# Patient Record
Sex: Female | Born: 1970 | Race: White | Hispanic: No | Marital: Married | Smoking: Never smoker
Health system: Southern US, Community
[De-identification: ages and names within clinical notes are randomized; demographics above are authoritative.]

## PROBLEM LIST (undated history)

## (undated) DIAGNOSIS — J302 Other seasonal allergic rhinitis: Secondary | ICD-10-CM

## (undated) HISTORY — PX: WISDOM TOOTH EXTRACTION: SHX21

## (undated) HISTORY — PX: DILATION AND CURETTAGE OF UTERUS: SHX78

## (undated) HISTORY — PX: OTHER SURGICAL HISTORY: SHX169

## (undated) HISTORY — PX: LASIK: SHX215

## (undated) HISTORY — DX: Other seasonal allergic rhinitis: J30.2

---

## 1998-08-23 ENCOUNTER — Other Ambulatory Visit: Admission: RE | Admit: 1998-08-23 | Discharge: 1998-08-23 | Payer: Self-pay | Admitting: Obstetrics and Gynecology

## 1999-09-05 ENCOUNTER — Other Ambulatory Visit: Admission: RE | Admit: 1999-09-05 | Discharge: 1999-09-05 | Payer: Self-pay | Admitting: Obstetrics and Gynecology

## 2000-09-10 ENCOUNTER — Other Ambulatory Visit: Admission: RE | Admit: 2000-09-10 | Discharge: 2000-09-10 | Payer: Self-pay | Admitting: Obstetrics and Gynecology

## 2001-09-10 ENCOUNTER — Other Ambulatory Visit: Admission: RE | Admit: 2001-09-10 | Discharge: 2001-09-10 | Payer: Self-pay | Admitting: Obstetrics and Gynecology

## 2002-09-23 ENCOUNTER — Other Ambulatory Visit: Admission: RE | Admit: 2002-09-23 | Discharge: 2002-09-23 | Payer: Self-pay | Admitting: Obstetrics and Gynecology

## 2003-09-29 ENCOUNTER — Other Ambulatory Visit: Admission: RE | Admit: 2003-09-29 | Discharge: 2003-09-29 | Payer: Self-pay | Admitting: Obstetrics and Gynecology

## 2004-10-03 ENCOUNTER — Other Ambulatory Visit: Admission: RE | Admit: 2004-10-03 | Discharge: 2004-10-03 | Payer: Self-pay | Admitting: Obstetrics and Gynecology

## 2005-10-19 ENCOUNTER — Other Ambulatory Visit: Admission: RE | Admit: 2005-10-19 | Discharge: 2005-10-19 | Payer: Self-pay | Admitting: Gynecology

## 2006-05-03 ENCOUNTER — Inpatient Hospital Stay (HOSPITAL_COMMUNITY): Admission: AD | Admit: 2006-05-03 | Discharge: 2006-05-07 | Payer: Self-pay | Admitting: Gynecology

## 2006-06-19 ENCOUNTER — Other Ambulatory Visit: Admission: RE | Admit: 2006-06-19 | Discharge: 2006-06-19 | Payer: Self-pay | Admitting: Gynecology

## 2007-06-25 ENCOUNTER — Other Ambulatory Visit: Admission: RE | Admit: 2007-06-25 | Discharge: 2007-06-25 | Payer: Self-pay | Admitting: Gynecology

## 2007-10-12 ENCOUNTER — Encounter: Payer: Self-pay | Admitting: Obstetrics and Gynecology

## 2007-10-12 ENCOUNTER — Ambulatory Visit (HOSPITAL_COMMUNITY): Admission: RE | Admit: 2007-10-12 | Discharge: 2007-10-12 | Payer: Self-pay | Admitting: Obstetrics and Gynecology

## 2008-02-17 ENCOUNTER — Ambulatory Visit (HOSPITAL_COMMUNITY): Admission: RE | Admit: 2008-02-17 | Discharge: 2008-02-17 | Payer: Self-pay | Admitting: Obstetrics and Gynecology

## 2008-02-17 ENCOUNTER — Encounter (INDEPENDENT_AMBULATORY_CARE_PROVIDER_SITE_OTHER): Payer: Self-pay | Admitting: Obstetrics and Gynecology

## 2009-03-29 ENCOUNTER — Other Ambulatory Visit: Payer: Self-pay | Admitting: Obstetrics and Gynecology

## 2009-03-29 ENCOUNTER — Inpatient Hospital Stay (HOSPITAL_COMMUNITY): Admission: RE | Admit: 2009-03-29 | Discharge: 2009-04-01 | Payer: Self-pay | Admitting: Obstetrics and Gynecology

## 2010-05-17 ENCOUNTER — Ambulatory Visit: Payer: Self-pay | Admitting: Obstetrics and Gynecology

## 2010-05-17 ENCOUNTER — Other Ambulatory Visit: Admission: RE | Admit: 2010-05-17 | Discharge: 2010-05-17 | Payer: Self-pay | Admitting: Gynecology

## 2010-11-16 ENCOUNTER — Encounter: Payer: Self-pay | Admitting: Obstetrics and Gynecology

## 2010-12-19 ENCOUNTER — Other Ambulatory Visit: Payer: Self-pay | Admitting: Obstetrics and Gynecology

## 2010-12-19 DIAGNOSIS — Z1231 Encounter for screening mammogram for malignant neoplasm of breast: Secondary | ICD-10-CM

## 2011-01-04 ENCOUNTER — Ambulatory Visit
Admission: RE | Admit: 2011-01-04 | Discharge: 2011-01-04 | Disposition: A | Discharge status: Home / Self Care | Payer: PRIVATE HEALTH INSURANCE | Source: Ambulatory Visit | Attending: Obstetrics and Gynecology | Admitting: Obstetrics and Gynecology

## 2011-01-04 DIAGNOSIS — Z1231 Encounter for screening mammogram for malignant neoplasm of breast: Secondary | ICD-10-CM

## 2011-01-23 LAB — CBC
HCT: 30.2 % — ABNORMAL LOW (ref 36.0–46.0)
Hemoglobin: 10.7 g/dL — ABNORMAL LOW (ref 12.0–15.0)
Hemoglobin: 13.3 g/dL (ref 12.0–15.0)
MCHC: 35.6 g/dL (ref 30.0–36.0)
MCHC: 35.2 g/dL (ref 30.0–36.0)
MCV: 96 fL (ref 78.0–100.0)
Platelets: 103 10*3/uL — ABNORMAL LOW (ref 150–400)
RDW: 12.9 % (ref 11.5–15.5)
RDW: 13.2 % (ref 11.5–15.5)
WBC: 10.7 10*3/uL — ABNORMAL HIGH (ref 4.0–10.5)

## 2011-01-23 LAB — RPR: RPR Ser Ql: NONREACTIVE

## 2011-02-28 NOTE — Op Note (Signed)
Jasmine [name], Jasmine [name]              ACCOUNT NO.:  [id number]   MEDICAL RECORD NO.:  [id number]          PATIENT TYPE:  INP   LOCATION:  9145                          [facility]ILITY:  [facility]   PHYSICIAN:  Lenoard Aden, M.D.DATE OF BIRTH:  03-03-1971   DATE OF PROCEDURE:  03/29/2009  DATE OF DISCHARGE:                               OPERATIVE REPORT   PREOPERATIVE DIAGNOSIS:  Previous C-section at 39-3/7 weeks' gestation.   POSTOPERATIVE DIAGNOSIS:  Previous C-section at 39-3/7 weeks' gestation.   PROCEDURE:  Repeat low segment transverse cesarean section.   SURGEON:  Lenoard Aden, MD   ASSISTANT:  None.   ANESTHESIA:  Spinal, Dorinda Hill T. Pamalee Leyden, MD   ESTIMATED BLOOD LOSS:  700 mL.   COMPLICATIONS:  None.   DRAINS:  Foley.   COUNTS:  Correct.   DISPOSITION:  The patient to recovery in good condition.   FINDINGS:  Full term living female 8 pounds 15 ounces, occiput anterior  position, Apgars 9 and 9.  Pediatricians attendance.  Posterior  placenta.  Normal uterus, normal tubes, normal ovaries.  Two-layer  uterine closure.   BRIEF OPERATIVE NOTE:  After being apprised of risks of anesthesia,  infection, bleeding, injury to bowel organs, need for repair delayed  versus immediate complications to include bowel and bladder injury.  The  patient was brought to the operating room where she was administered a  spinal anesthetic without complications, prepped and draped in usual  sterile fashion.  Foley catheter placed after achieving adequate  anesthesia.  Dilute Marcaine solution placed.  Pfannenstiel skin  incision made with scalpel and carried down to fascia which was nicked  in the midline and opened transversely using Mayo scissors.  Rectus  muscles were dissected sharply in the midline.  Peritoneum was entered  sharply.  Bladder blade placed.  Visceral peritoneum scored sharply off  the lower uterine segment.  Kerr hysterotomy incision made.  Atraumatic  delivery full term  living female as noted, handed to pediatricians  attendance.  Apgars 9 and 9.  Cord blood collected.  Placenta delivered  manually, intact 3-vessel cord.  Uterus was exteriorized, curetted using  a dry lap pack and closed in 2 running imbricating layers of 0 Monocryl  suture.  Adequate hemostasis in the midline with 2 interrupted sutures.  Good hemostasis noted.  Irrigation  accomplished.  Bladder flap intact.  Clear urine noted at this time.  The fascia was reapproximated using a 0 Monocryl in a continuous running  fashion.  Skin was reapproximated using staples.  The patient tolerated  the procedure well and was transferred to recovery in good condition.      Lenoard Aden, M.D.  Electronically Signed     RJT/MEDQ  D:  03/29/2009  T:  03/29/2009  Job:  [id number]

## 2011-02-28 NOTE — Op Note (Signed)
NAMELAYAAN, MOTT              ACCOUNT NO.:  [id number]   MEDICAL RECORD NO.:  [id number]          PATIENT TYPE:  AMB   LOCATION:  SDC                           [facility]   PHYSICIAN:  Daniel L. Gottsegen, M.D.DATE OF BIRTH:  1971-03-10   DATE OF PROCEDURE:  10/12/2007  DATE OF DISCHARGE:                               OPERATIVE REPORT   PREOPERATIVE DIAGNOSIS:  Missed abortion.   POSTOPERATIVE DIAGNOSIS:  Missed abortion.   OPERATIONS:  Suction curettage.   SURGEON:  Edyth Gunnels, M.D.   ANESTHESIA:  MAC plus paracervical block.   INDICATIONS:  The patient is a 40 year old gravida 2 para 1, AB zero  whose last menstrual period was 08/10/2007.  She was seen on 10/04/2007.  An ultrasound was done which appeared to be consistent with a missed AB  rather than a viable pregnancy.  It was elected to watch her for a week  and do a follow-up serial ultrasound.  This was done on 10/11/2007.  At  this point the intrauterine sac with echogenic yolk sac was seen but it  actually had been decreased in size since the first ultrasound.  There  was no fetal heart activity.  The patient was significantly behind in  dates and based on the above a diagnosis of a missed AB was made.  The  patient had deliberated whether to have a spontaneous AB or have a  suction curettage to end the pregnancy and at this point, she elected to  do the suction curettage.  The patient is A+.   FINDINGS:  External is normal.  BUS is normal.  Vaginal is normal.  Cervix is clean.  Uterus is just top normal size and shape.  Adnexa  failed to reveal masses.  At the time of suction curettage tissue was  consistent with a missed AB.   PROCEDURE:  After adequate MAC anesthetic a 20 mL 1% plain paracervical  block was done by Dr. Eda Paschal.  Anesthesia was excellent.  The cervix  could then be dilated to a #23 Pratt dilator and a suction curettage was  done with a #8 vacuum curette.  Tissue was consistent with  a missed AB.  At the termination of procedure there was no significant bleeding and  the uterus was well contracted.  The procedure was terminated.  The  patient tolerated the procedure well.  Blood loss was minimal.  She left  the operating room in satisfactory condition.      Daniel L. Eda Paschal, M.D.  Electronically Signed     DLG/MEDQ  D:  10/12/2007  T:  10/12/2007  Job:  [id number]

## 2011-03-03 NOTE — Op Note (Signed)
[name], [name]              ACCOUNT NO.:  [id number]   MEDICAL RECORD NO.:  [id number]          PATIENT TYPE:  AMB   LOCATION:  SDC                           [facility]   PHYSICIAN:  Lenoard Aden, M.D.DATE OF BIRTH:  05/28/1971   DATE OF PROCEDURE:  03/08/2008  DATE OF DISCHARGE:                               OPERATIVE REPORT   PREOPERATIVE DIAGNOSIS:  Missed abortion.   POSTOPERATIVE DIAGNOSIS:  Missed abortion.   PROCEDURE:  Suction D and E.   SURGEON:  Lenoard Aden, MD   ANESTHESIA:  MAC paracervical.   ESTIMATED BLOOD LOSS:  50 mL.   COMPLICATIONS:  None.   DRAINS:  None.   COUNTS:  Correct.   The patient to recovery in good condition.  Products of conception to  pathology and separately for chromosomal analysis for recurrent  pregnancy loss.   BRIEF OPERATIVE NOTE:  After being apprised of risks of anesthesia,  infection, bleeding, need for repair, delayed versus immediate  complications to include possible uterine perforation, need for repair,  the patient was brought to the operating room, where she was  administered a IV sedation without difficulty, prepped and draped in  usual sterile fashion, catheterized and the bladder was emptied  achieving adequate anesthesia.  Weighted speculum placed per vagina.  A  single-tooth tenaculum placed on the anterior lip of cervix.  Cervix is  sounded to uterus sounds at 12 cm.  Cervix was easily dilated with #25  Shawnie Pons dilator.  Curved suction curette 8 mm was placed.  Products of  conception aspirated and noted.  Repeat suction curettage in four-  quadrant method reveals cavity to be empty, tissue collected for  chromosomes.  Good hemostasis noted.  There was a bleeding from the left  anterior lip tenaculum site which was secured using a 2-0 chromic suture  in silver nitrate.  Good hemostasis noted.  All instruments removed.  The patient tolerated the procedure well and was transferred to recovery  in  good condition.      Lenoard Aden, M.D.  Electronically Signed    RJT/MEDQ  D:  02/17/2008  T:  02/17/2008  Job:  [id number]

## 2011-03-03 NOTE — Op Note (Signed)
NAMEJENNALEE, Jasmine [name]              ACCOUNT NO.:  [id number]   MEDICAL RECORD NO.:  [id number]          PATIENT TYPE:  INP   LOCATION:  9163                          [facility]   PHYSICIAN:  Timothy P. Fontaine, M.D.DATE OF BIRTH:  February 02, 1971   DATE OF PROCEDURE:  05/04/2006  DATE OF DISCHARGE:                                 OPERATIVE REPORT   PREOPERATIVE DIAGNOSES:  1.  Intrauterine pregnancy at term.  2.  Cephalopelvic disproportion.  3.  Arrest of dilatation.   PROCEDURE:  Primary low transverse cervical cesarean section.   SURGEON:  Timothy P. Fontaine, M.D.   ASSISTANT:  Scrub technician.   ANESTHETIC:  Epidural.   COMPLICATIONS:  None.   SPECIMEN:  Samples of cord blood.   ESTIMATED BLOOD LOSS:  Less than 500 mL.   FINDINGS:  At 38, normal female, Apgar scores 9/9, weight 8 pounds 10  ounces.  Pelvic anatomy noted to be normal.   PROCEDURE:  The patient was taken to the operating room, underwent dosing of  her epidural catheter, was placed in the left tilt supine position, received  an abdominal preparation with Betadine solution and was draped in the usual  fashion.  The patient had a Foley catheter previously placed on Labor and  Delivery and it is noted that the patient did have hematuria prior to  initiation of the procedure.  After assuring adequate anesthesia, the  abdomen was sharply entered through a Pfannenstiel incision, achieving  adequate hemostasis at all levels.  The bladder flap was sharply and bluntly  developed without difficulty, uterus sharply entered in the lower uterine  segment and bluntly extended laterally.  The fluid was noted to be clear,  the infant's head delivered through the incision, nares and mouth suctioned,  a nuchal cord x1 was reduced, the rest of the infant delivered, the cord  doubly clamped and cut and the infant handed to Pediatrics in attendance.  Samples of cord blood were obtained.  Placenta was then spontaneously  extruded and noted to be intact.  The public cord blood banking was not  obtained due to the hour and it was not available at this time.  The uterus  was then exteriorized, the endometrial cavity explored with the sponge to  remove all placental and membrane fragments.  The patient received 1-g Ancef  antibiotic prophylaxis at this time.  The uterine incision was then closed  in 2 layers using 0 Vicryl suture, first with a running interlocking stitch  followed by an imbricating stitch.  The uterus was returned to the abdomen,  which was copiously irrigated, adequate hemostasis visualized, the anterior  fascia reapproximated using 0 Vicryl suture in a running stitch.  Subcutaneous tissues were  irrigated, adequate hemostasis achieved with electrocautery and the skin was  reapproximated using 4-0 Vicryl in a running subcuticular stitch, Steri-  Strips and Benzoin applied, pressure dressing applied.  The patient was  taken to the recovery room in good condition, having tolerated the procedure  well      Timothy P. Fontaine, M.D.  Electronically Signed     TPF/MEDQ  D:  05/04/2006  T:  05/05/2006  Job:  [id number]

## 2011-03-03 NOTE — Consult Note (Signed)
Jasmine [name], Jasmine [name]              ACCOUNT NO.:  [id number]   MEDICAL RECORD NO.:  [id number]          PATIENT TYPE:  INP   LOCATION:  9163                          [facility]   PHYSICIAN:  Timothy P. Fontaine, M.D.DATE OF BIRTH:  [birth year]   DATE OF CONSULTATION:  05/04/2006  DATE OF DISCHARGE:                                   CONSULTATION   Patient evaluated on labor and delivery.  External tracing shows  contractions with an adequate contraction pattern, approximately 200 MVU's  units and a reactive, reassuring tracing with early type mild decelerations.  There is good beat-to-beat variability with accelerations noted.   PHYSICAL EXAMINATION:  PELVIC:  Vertex at approximately 0 to -1 station with  increasing caput noted.  Cervix 6 cm dilated, completely effaced.  Exam  reported per nursing at 12:30 was 6 cm.  Repeat at approximately 1600 at 6  cm.   I reviewed the situation with the patient and her husband.  Does not appear  to be any progress since her 12:30 exam.  These were two serial nursing with  my initial exam now.  I feel, given the total picture, that most likely we  have cephalopelvic disproportion.  My recommendation was to do a short  interval exam at approximately a half hour, again, to confirm no change in  the cervix, recognizing it as a short interval but total length was 4-5  hours.  If the exam remains unchanged, then would proceed with a primary  cesarean section.  The procedure was reviewed, which involved the expected  intraoperative postoperative course and the risks of bleeding, transfusion.  The risks of transfusion, with transfusion reaction, hepatitis, HIV, mad cow  disease and other unknown entities.  The risks of infection requiring  prolonged antibiotics as well as the risk of bleeding complications,  including opening and draining of incisions, closure by secondary intention  was all discussed, withstood, and accepted.  The risk of inadvertant  injury  to internal organs, including bowel, bladder, ureters, vessels, and nerves,  necessitating major exploratory reparative surgeries, and future reparative  surgeries, including ostomy formation, was all discussed, understood, and  accepted.  The risk of fetal injury during the birthing process, including  musculoskeletal, neural, scalpel injuries was all reviewed and accepted.  The patient and her husband's questions were answered, and they are  comfortable with the plan.      Timothy P. Fontaine, M.D.  Electronically Signed     TPF/MEDQ  D:  05/04/2006  T:  05/04/2006  Job:  [id number]

## 2011-03-03 NOTE — Discharge Summary (Signed)
NAMEPARTHENA, FERGESON              ACCOUNT NO.:  [id number]   MEDICAL RECORD NO.:  [id number]          PATIENT TYPE:  INP   LOCATION:  9145                          [facility]   PHYSICIAN:  Lenoard Aden, M.D.DATE OF BIRTH:  1971-08-29   DATE OF ADMISSION:  03/29/2009  DATE OF DISCHARGE:  04/01/2009                               DISCHARGE SUMMARY   ADMITTING DIAGNOSIS:  72 weeks' gestation, previous cesarean section,  desires repeat cesarean section.   DISCHARGE DIAGNOSES:  Postoperative day #3, repeat cesarean section,  stable status, mild gestational thrombocytopenia, stable.   The patient is a 40 year old gravida 4, para 1-0-2-1 at 2 weeks and 5  days' gestation, presents for repeat scheduled cesarean section.   HOSPITAL PRESENTATION:  The patient is here for scheduled repeat  cesarean section.   DICTATION ENDED AT THIS POINT.      Marlinda Mike, C.N.M.      Lenoard Aden, M.D.  Electronically Signed    TB/MEDQ  D:  05/01/2009  T:  05/01/2009  Job:  [id number]

## 2011-03-03 NOTE — Discharge Summary (Signed)
NAMEXAVIERA, [name]              ACCOUNT NO.:  [id number]   MEDICAL RECORD NO.:  [id number]          PATIENT TYPE:  INP   LOCATION:  9145                          [facility]   PHYSICIAN:  Lenoard Aden, M.D.DATE OF BIRTH:  1970/12/01   DATE OF ADMISSION:  03/29/2009  DATE OF DISCHARGE:  04/01/2009                               DISCHARGE SUMMARY   ADMITTING DIAGNOSIS:  39 weeks' gestation and 3 days, previous cesarean  section, desires repeat.   DISCHARGE DIAGNOSES:  Postoperative day #3, repeat cesarean section,  stable status, mild gestational thrombocytopenia.   PRENATAL COURSE:  The patient is a gravida 4, para 1-0-2-1 at 63 weeks'  gestation.  Prenatal care has been at [facility] since 8 weeks'  gestation with Dr. Billy Coast as primary.   PRENATAL LABS:  The patient is A positive, rubella positive, GBS  positive, RPR negative, syphilis screen negative, HIV negative, and EDC  of April 02, 2009.  Prenatal course has been uneventful with noted mild  gestational thrombocytopenia with a nadir platelets of 122.   HOSPITAL PRESENTATION:  The patient presents for repeat scheduled  cesarean section.  Vital signs are within normal limits.  The patient is  afebrile.  Preop CBC, white blood cell count 10.7, hemoglobin 10.9,  hematocrit 31.2, and platelet count of 183.  Delivery, the patient was  delivered by Dr. Billy Coast, repeat cesarean section on March 29, 2009, of a  female newborn at 8 pounds 15 ounces, see operative note.   POSTOPERATIVE COURSE:  Postoperative CBC, white blood cell count 8.9,  hemoglobin 10.7, hematocrit 30.2, and platelet count of 115.  Vital  signs remained normal.  The patient was afebrile during first operative  course and vital signs on day of discharge were 98.2, 66, 18, and  104/62.  Activity is ad lib.  The patient is up ambulating, tolerating  diet.  There is a mild adhesive tape reaction with mild erythema.  No  excoriations.  On day of discharge,  incision is intact with staples.  No  drainage.  No erythema.  Mild amount of ecchymosis noted below the  incision.  Decision was made for staple removal on day 7 versus on day  of discharge due to a small amount of bruising and repeat cesarean  section.   DISCHARGE INSTRUCTIONS:  Per [facility] booklet for postoperative  care.  The patient is on a regular diet.  Activity is ad lib as  tolerated per postoperative instructions.   MEDICATIONS AT THE TIME OF DISCHARGE:  1. Prenatal vitamin one p.o. daily.  2. Motrin over-the-counter 2-4 tablets every 8 hours as needed for      discomfort.  3. Darvocet-N 100 one p.o. q.4-6 h. as needed for pain.   The patient IS to follow up at [facility] at 6 weeks' postpartum  visit.      Marlinda Mike, C.N.M.      Lenoard Aden, M.D.  Electronically Signed    TB/MEDQ  D:  05/01/2009  T:  05/01/2009  Job:  [id number]

## 2011-03-03 NOTE — H&P (Signed)
Jasmine [name], Jasmine [name]              ACCOUNT NO.:  [id number]   MEDICAL RECORD NO.:  [id number]          PATIENT TYPE:  MAT   LOCATION:  MATC                          [facility]   PHYSICIAN:  Juan H. Lily Peer, M.D.DATE OF BIRTH:  May 24, 1971   DATE OF ADMISSION:  05/03/2006  DATE OF DISCHARGE:                                HISTORY & PHYSICAL   CHIEF COMPLAINT:  Spontaneous rupture of membranes.   HISTORY:  The patient is a 40 year old, gravida 1, para 0 with an estimated  date of confinement May 14, 2006, currently 38-1/2 weeks estimated  gestational age.  Presented to [facility] complaining of ruptured  membranes since 2230 hours on May 03, 2006.  On arrival, clear amniotic  fluid was present.  Nitrazine and ferning positive.  She is found to be  contracting every three to five minutes apart with a reassuring fetal heart  rate tracing.  Pelvic exam:  Her cervix was finger tip and 50% effaced,  vertex presentation by Thayer Ohm maneuver.  The patient's prenatal course  significant to the fact that due to advanced maternal age she was offered  genetic amniocentesis and declined.  She had first trimester screen which  was normal and had normal cystic fibrosis screen.  Did not have  alphafetaprotein testing.  The remainder of her prenatal course was  essentially unremarkable.  She denies any allergies.   REVIEW OF SYSTEMS:  See [employer] form.   PHYSICAL EXAMINATION:  VITAL SIGNS:  Her temperature was 98.6, pulse 87,  respiration 20, blood pressure 139/78.  HEENT:  Unremarkable.  NECK:  Supple.  Trachea midline.  No carotid bruits.  No thyromegaly.  LUNGS:  Clear to auscultation without any rhonchi or wheezes.  HEART:  Regular rate and rhythm.  No murmurs or gallops.  BREASTS:  Not done.  ABDOMEN:  Gravid uterus.  Vertex presentation by [facility] maneuver.  PELVIC:  Gross rupture of membranes, clear.  Cervix fingertip, 60-70%  effaced, vertex presentation, ballottable.  EXTREMITIES:  DTR 1+.  Negative clonus.  Trace edema.   PRENATAL LABS:  A positive blood, negative antibody screen.  VDRL was  nonreactive.  Rubella was negative.  Hepatitis B surface antigen and HIV  were negative.  The patient declined maternal serum alphafetaprotein.  Normal cystic fibrosis screen and normal first trimester screen.  Diabetes  screen was normal and GBS culture was reported to be negative.   ASSESSMENT:  A 40 year old, gravida 1, para 0, 38-1/2 weeks estimated  gestational age with gross rupture of membranes at 2230 hours, clear and in  labor.  Cervix fingertip, 60-70% effaced, vertex presentation by Thayer Ohm  maneuver. Group B strep culture negative.   PLAN:  Admit to labor and delivery.  Initiate Pitocin augmentation in the  event of protracted labor.  Anticipate vaginal delivery.      Juan H. Lily Peer, M.D.  Electronically Signed     JHF/MEDQ  D:  05/04/2006  T:  05/04/2006  Job:  [id number]

## 2011-03-03 NOTE — Discharge Summary (Signed)
Jasmine [name], Jasmine [name]              ACCOUNT NO.:  [id number]   MEDICAL RECORD NO.:  [id number]          PATIENT TYPE:  INP   LOCATION:  9128                          [facility]   PHYSICIAN:  Timothy P. Fontaine, M.D.DATE OF BIRTH:  07-May-1971   DATE OF ADMISSION:  05/03/2006  DATE OF DISCHARGE:  05/07/2006                                 DISCHARGE SUMMARY   DISCHARGE DIAGNOSES:  1.  Pregnancy at term.  2.  Cephalopelvic disproportion.   PROCEDURE:  Primary low transverse cervical cesarean section May 04, 2006,  Dr. Colin Broach.   HOSPITAL COURSE:  A 40 year old G1 P0 female at term gestation, who enters  with rupture of membranes and spontaneous contractions.  The patient was  initially fingertip dilated, ultimately was begun on Pitocin augmentation,  and despite adequate labor pattern had arrest of dilatation at 3-4 cm.  The  patient was counseled and ultimately underwent a primary low transverse  cervical cesarean section producing a normal female infant, Apgars  9 and 9,  weight 8 pounds 10 ounces, at 1811, May 04, 2006.  The patient's  postoperative course was uncomplicated.  She was discharged on postoperative  day #3 ambulating well, tolerating a regular diet with a postoperative  hemoglobin of 10.8.  The patient's blood type A+.  She is rubella negative  and is to receive the rubella vaccine before discharge.  The patient  received precautions, instructions and follow-up, will be seen in the office  in 6 weeks, and received a prescription for Tylox #25 one to two p.o. q.4-  6h. p.r.n. pain.      Timothy P. Fontaine, M.D.  Electronically Signed     TPF/MEDQ  D:  05/07/2006  T:  05/07/2006  Job:  [id number]

## 2011-05-22 ENCOUNTER — Encounter: Payer: Self-pay | Admitting: *Deleted

## 2011-05-25 ENCOUNTER — Encounter: Payer: Self-pay | Admitting: Gynecology

## 2011-05-25 ENCOUNTER — Ambulatory Visit (INDEPENDENT_AMBULATORY_CARE_PROVIDER_SITE_OTHER): Payer: 59 | Admitting: Gynecology

## 2011-05-25 ENCOUNTER — Other Ambulatory Visit (HOSPITAL_COMMUNITY)
Admission: RE | Admit: 2011-05-25 | Discharge: 2011-05-25 | Disposition: A | Discharge status: Home / Self Care | Payer: 59 | Source: Ambulatory Visit | Attending: Gynecology | Admitting: Gynecology

## 2011-05-25 VITALS — BP 112/70 | Ht 63.5 in | Wt 135.0 lb

## 2011-05-25 DIAGNOSIS — Z01419 Encounter for gynecological examination (general) (routine) without abnormal findings: Secondary | ICD-10-CM

## 2011-05-25 DIAGNOSIS — Z131 Encounter for screening for diabetes mellitus: Secondary | ICD-10-CM

## 2011-05-25 DIAGNOSIS — R5383 Other fatigue: Secondary | ICD-10-CM

## 2011-05-25 DIAGNOSIS — Z13220 Encounter for screening for lipoid disorders: Secondary | ICD-10-CM

## 2011-05-25 NOTE — Progress Notes (Signed)
Jasmine [name] 04-07-1971 [id number]        40 y.o.  for annual exam.  Past medical history,surgical history, allergies, family history and social history were all reviewed and documented in the EPIC chart. ROS:  Was performed and pertinent positives and negatives are included in the history.  Exam: chaperone present Filed Vitals:   05/25/11 0957  BP: 112/70   General appearance  Normal Skin grossly normal Head/Neck normal with no cervical or supraclavicular adenopathy thyroid normal Lungs  clear Cardiac RR, without RMG Abdominal  soft, nontender, without masses, organomegaly or hernia Breasts  examined lying and sitting without masses, retractions, discharge or axillary adenopathy. Pelvic  Ext/BUS/vagina  normal   Cervix  normal  Pap done  Uterus  anteverted, normal size, shape and contour, midline and mobile nontender   Adnexa  Without masses or tenderness    Anus and perineum  normal   Rectovaginal  normal sphincter tone without palpated masses or tenderness.    Assessment/Plan:  40 y.o. female for annual exam.   Doing well.   Using condoms for contraception. We discussed contraceptive options to include oral contraceptives, IUD, injectables and implants, sterilization.  Patient's comfortable with condoms she understands the failure risk. Availability of Plan B was discussed with her.   Self breast exams on a month basis discussed encouraged. She had her mammogram in March and will continue with annual mammography.   She does note some fatigue which I think is situational with children and work. She has no other symptoms such as weight gain weight loss hair or skin changes. Will check baseline CBC and TSH along with routine bloods of glucose lipid profile urinalysis. Assuming normal then she'll see me in a year sooner as needed.    Dara Lords MD, 10:33 AM 05/25/2011

## 2011-05-28 MED ORDER — FLUCONAZOLE 150 MG PO TABS: 150.0000 mg | ORAL_TABLET | Freq: Once | ORAL | Status: AC

## 2011-05-28 NOTE — Progress Notes (Signed)
Addended by: Dara Lords on: 05/28/2011 01:38 PM   Modules accepted: Orders

## 2011-06-12 ENCOUNTER — Telehealth: Payer: Self-pay | Admitting: *Deleted

## 2011-06-12 NOTE — Telephone Encounter (Signed)
Pt called wanting recent lab results, results given to patient.

## 2011-09-06 ENCOUNTER — Other Ambulatory Visit: Payer: Self-pay

## 2011-09-06 MED ORDER — FLUCONAZOLE 150 MG PO TABS: 150.0000 mg | ORAL_TABLET | Freq: Once | ORAL | Status: AC

## 2011-09-06 NOTE — Telephone Encounter (Signed)
PT. NOTIFIED RX SENT IN BY DR. TF.

## 2011-09-06 NOTE — Telephone Encounter (Signed)
Diflucan 150mg x 1

## 2011-09-06 NOTE — Telephone Encounter (Signed)
PT. C-O SEVERE VAGINAL ITCHING AND CAN NOT COME IN FOR AN O.V. REQUESTING DIFLUCAN BE CALLED IN. SHE SAW YOU ON 05-25-11 FOR HER AEX.

## 2012-01-25 ENCOUNTER — Other Ambulatory Visit: Payer: Self-pay | Admitting: Obstetrics and Gynecology

## 2012-01-25 DIAGNOSIS — Z1231 Encounter for screening mammogram for malignant neoplasm of breast: Secondary | ICD-10-CM

## 2012-02-07 ENCOUNTER — Ambulatory Visit
Admission: RE | Admit: 2012-02-07 | Discharge: 2012-02-07 | Disposition: A | Discharge status: Home / Self Care | Payer: Managed Care, Other (non HMO) | Source: Ambulatory Visit | Attending: Obstetrics and Gynecology | Admitting: Obstetrics and Gynecology

## 2012-02-07 DIAGNOSIS — Z1231 Encounter for screening mammogram for malignant neoplasm of breast: Secondary | ICD-10-CM

## 2012-06-27 ENCOUNTER — Encounter: Payer: Self-pay | Admitting: Gynecology

## 2012-07-12 ENCOUNTER — Encounter: Payer: Self-pay | Admitting: Gynecology

## 2012-07-12 ENCOUNTER — Ambulatory Visit (INDEPENDENT_AMBULATORY_CARE_PROVIDER_SITE_OTHER): Payer: Managed Care, Other (non HMO) | Admitting: Gynecology

## 2012-07-12 VITALS — BP 112/74 | Ht 64.5 in | Wt 133.0 lb

## 2012-07-12 DIAGNOSIS — N926 Irregular menstruation, unspecified: Secondary | ICD-10-CM

## 2012-07-12 DIAGNOSIS — Z131 Encounter for screening for diabetes mellitus: Secondary | ICD-10-CM

## 2012-07-12 DIAGNOSIS — Z13220 Encounter for screening for lipoid disorders: Secondary | ICD-10-CM

## 2012-07-12 DIAGNOSIS — Z01419 Encounter for gynecological examination (general) (routine) without abnormal findings: Secondary | ICD-10-CM

## 2012-07-12 LAB — CBC WITH DIFFERENTIAL/PLATELET
Hemoglobin: 13.4 g/dL (ref 12.0–15.0)
Lymphocytes Relative: 24 % (ref 12–46)
Lymphs Abs: 1.3 10*3/uL (ref 0.7–4.0)
Monocytes Relative: 8 % (ref 3–12)
Neutrophils Relative %: 66 % (ref 43–77)
Platelets: 160 10*3/uL (ref 150–400)
RBC: 4.53 MIL/uL (ref 3.87–5.11)
WBC: 5.5 10*3/uL (ref 4.0–10.5)

## 2012-07-12 LAB — LIPID PANEL
Cholesterol: 174 mg/dL (ref 0–200)
VLDL: 22 mg/dL (ref 0–40)

## 2012-07-12 LAB — TSH: TSH: 1.163 u[IU]/mL (ref 0.350–4.500)

## 2012-07-12 LAB — PROLACTIN: Prolactin: 4.9 ng/mL

## 2012-07-12 LAB — GLUCOSE, RANDOM: Glucose, Bld: 82 mg/dL (ref 70–99)

## 2012-07-12 NOTE — Progress Notes (Signed)
Jasmine [name] 1970-12-05 [id number]        41 y.o.  [id number] for annual exam.  Several issues noted below.  Past medical history,surgical history, medications, allergies, family history and social history were all reviewed and documented in the EPIC chart. ROS:  Was performed and pertinent positives and negatives are included in the history.  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 Pelvic  Ext/BUS/vagina  normal   Cervix  normal   Uterus  axial, normal size, shape and contour, midline and mobile nontender   Adnexa  Without masses or tenderness    Anus and perineum  normal   Rectovaginal  normal sphincter tone without palpated masses or tenderness.    Assessment/Plan:  41 y.o. [id number] female for annual exam.   1. Prolonged menses. Patient notes having monthly menses but they're lasting up to 7 days with lingering spotting. Been going on for 4-5 months. No significant dysmenorrhea, intermenstrual bleeding. Having some hot flashes perimenstually. We'll check baseline labs of prolactin TSH FSH and sonohysterogram to rule out structural abnormalities such as polyps, submucous myomas. Various scenarios were reviewed and we'll further discuss after laboratory/ultrasound results. 2. Contraception. Patient continues to use condoms. We've discussed the failure risks with this on multiple occasions and alternatives. Patient declined and desires to continue using condoms. Plan B back up reviewed. 3. Mammography. Patient had mammogram in April continue with annual mammography. SBE monthly  reviewed. 4. Pap smear. No Pap smear done today. Last Pap smear 2012. Patient has numerous normal reports with no history of abnormal Pap smears. We'll plan less frequent screening every 3-5 years per current screening guidelines. 5. Health maintenance.  Baseline CBC lipid profile glucose urinalysis ordered along with the other lab work. Follow up for ultrasound and lab results. 6.     Dara Lords MD, 11:14 AM 07/12/2012

## 2012-07-12 NOTE — Patient Instructions (Signed)
Follow up for ultrasound. 

## 2012-07-13 LAB — URINALYSIS W MICROSCOPIC + REFLEX CULTURE
Bilirubin Urine: NEGATIVE
Casts: NONE SEEN
Crystals: NONE SEEN
Glucose, UA: NEGATIVE mg/dL
Specific Gravity, Urine: 1.01 (ref 1.005–1.030)
Squamous Epithelial / LPF: NONE SEEN
Urobilinogen, UA: 0.2 mg/dL (ref 0.0–1.0)
pH: 8 (ref 5.0–8.0)

## 2012-07-19 ENCOUNTER — Telehealth: Payer: Self-pay | Admitting: Women's Health

## 2012-07-19 NOTE — Telephone Encounter (Signed)
Telephone call from patient states cycle started today, sonohysterogram with Dr. Audie Box scheduled for 10/16 which will be day 13. Will abstain until after her sonohysterogram.

## 2012-07-24 ENCOUNTER — Encounter: Payer: Self-pay | Admitting: Gynecology

## 2012-07-30 ENCOUNTER — Other Ambulatory Visit: Payer: Self-pay | Admitting: Gynecology

## 2012-07-30 DIAGNOSIS — N926 Irregular menstruation, unspecified: Secondary | ICD-10-CM

## 2012-07-31 ENCOUNTER — Ambulatory Visit (INDEPENDENT_AMBULATORY_CARE_PROVIDER_SITE_OTHER): Payer: Managed Care, Other (non HMO)

## 2012-07-31 ENCOUNTER — Ambulatory Visit (INDEPENDENT_AMBULATORY_CARE_PROVIDER_SITE_OTHER): Payer: Managed Care, Other (non HMO) | Admitting: Gynecology

## 2012-07-31 ENCOUNTER — Encounter: Payer: Self-pay | Admitting: Gynecology

## 2012-07-31 DIAGNOSIS — N926 Irregular menstruation, unspecified: Secondary | ICD-10-CM

## 2012-07-31 DIAGNOSIS — N888 Other specified noninflammatory disorders of cervix uteri: Secondary | ICD-10-CM

## 2012-07-31 DIAGNOSIS — N72 Inflammatory disease of cervix uteri: Secondary | ICD-10-CM

## 2012-07-31 DIAGNOSIS — N940 Mittelschmerz: Secondary | ICD-10-CM

## 2012-07-31 DIAGNOSIS — N831 Corpus luteum cyst of ovary, unspecified side: Secondary | ICD-10-CM

## 2012-07-31 DIAGNOSIS — N921 Excessive and frequent menstruation with irregular cycle: Secondary | ICD-10-CM

## 2012-07-31 NOTE — Progress Notes (Signed)
Patient presents for sonohysterogram due to metromenorrhagia with prolonged menses. Her blood work to include FSH TSH prolactin were normal.  Ultrasound shows uterus to be normal in size and echotexture. Endometrial echo 11.5 mm. Right and left ovaries normal with physiologic changes. Cul-de-sac negative. She did have a fair dilatation of the endocervical canal with echogenic debris. Sonohysterogram performed, tight cervix noted, sterile technique, easy catheter introduction, good distention with no abnormalities. Endometrial sample taken. Patient tolerated well.  Assessment and plan: Metrorrhagia. Exam/ultrasound suggests some mild cervical stenosis with accumulation of fluid/debris behind the external os. I dilated the cervix with a Silastic graduated dilator. Patient will follow up for biopsy results. Will keep menstrual calendar over the next several months and we will see how she does. I question whether mild cervical stenosis is causing her menses to be prolonged. She's not having significant dysmenorrhea or other evidence of accumulation. If continues possible dilatation under paracervical block more aggressively. Possible Mirena IUD or other hormonal manipulation to decrease menstrual production.

## 2012-07-31 NOTE — Patient Instructions (Signed)
Office will call you with the biopsy results. Monitor menses over the next 3-6 months. Call if prolonged/heavy bleeding continues.

## 2013-01-29 ENCOUNTER — Other Ambulatory Visit: Payer: Self-pay

## 2013-01-29 DIAGNOSIS — Z1231 Encounter for screening mammogram for malignant neoplasm of breast: Secondary | ICD-10-CM

## 2013-02-21 ENCOUNTER — Ambulatory Visit: Payer: Self-pay

## 2013-03-24 ENCOUNTER — Ambulatory Visit
Admission: RE | Admit: 2013-03-24 | Discharge: 2013-03-24 | Disposition: A | Discharge status: Home / Self Care | Payer: Managed Care, Other (non HMO) | Source: Ambulatory Visit

## 2013-03-24 DIAGNOSIS — Z1231 Encounter for screening mammogram for malignant neoplasm of breast: Secondary | ICD-10-CM

## 2013-07-28 ENCOUNTER — Encounter: Payer: Self-pay | Admitting: Gynecology

## 2013-08-18 ENCOUNTER — Encounter: Payer: Self-pay | Admitting: Gynecology

## 2013-08-18 ENCOUNTER — Ambulatory Visit (INDEPENDENT_AMBULATORY_CARE_PROVIDER_SITE_OTHER): Payer: Managed Care, Other (non HMO) | Admitting: Gynecology

## 2013-08-18 VITALS — BP 110/70 | Ht 64.5 in | Wt 138.0 lb

## 2013-08-18 DIAGNOSIS — Z01419 Encounter for gynecological examination (general) (routine) without abnormal findings: Secondary | ICD-10-CM

## 2013-08-18 LAB — CBC WITH DIFFERENTIAL/PLATELET
Eosinophils Absolute: 0 10*3/uL (ref 0.0–0.7)
Eosinophils Relative: 1 % (ref 0–5)
Lymphs Abs: 1.3 10*3/uL (ref 0.7–4.0)
MCH: 30.6 pg (ref 26.0–34.0)
MCV: 89.8 fL (ref 78.0–100.0)
Monocytes Relative: 7 % (ref 3–12)
Platelets: 180 10*3/uL (ref 150–400)
RBC: 4.31 MIL/uL (ref 3.87–5.11)

## 2013-08-18 LAB — LIPID PANEL
Cholesterol: 187 mg/dL (ref 0–200)
HDL: 51 mg/dL (ref >39)
Total CHOL/HDL Ratio: 3.7 Ratio
Triglycerides: 208 mg/dL — ABNORMAL HIGH (ref <150)
VLDL: 42 mg/dL — ABNORMAL HIGH (ref 0–40)

## 2013-08-18 LAB — COMPREHENSIVE METABOLIC PANEL
CO2: 29 mEq/L (ref 19–32)
Creat: 0.74 mg/dL (ref 0.50–1.10)
Glucose, Bld: 61 mg/dL — ABNORMAL LOW (ref 70–99)
Total Bilirubin: 0.5 mg/dL (ref 0.3–1.2)

## 2013-08-18 NOTE — Progress Notes (Signed)
Jasmine [name] 1971-09-04 [id number]        42 y.o.  [id number] for annual exam.  Doing well without complaints.  Past medical history,surgical history, problem list, medications, allergies, family history and social history were all reviewed and documented in the EPIC chart.  ROS:  Performed and pertinent positives and negatives are included in the history, assessment and plan .  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 Pelvic  Ext/BUS/vagina  normal  Cervix  normal  Uterus  anteverted, normal size, shape and contour, midline and mobile nontender   Adnexa  Without masses or tenderness    Anus and perineum  normal   Rectovaginal  normal sphincter tone without palpated masses or tenderness.    Assessment/Plan:  42 y.o. [id number] female for annual exam, regular menses, condom birth control.   1. Patient evaluated last year for prolonged menses. Sonohysterogram was negative, she did have cervical stenosis and it appears that by dilating her cervix this has resolved the issue. She'll continue to monitor. 2. Contraceptive management. Patient continues with condoms. I reviewed the failure risk and alternatives. Patient declined and plans to continue condoms. Availability of plan B. reviewed. 3. Mammography 03/2013. Continue with annual mammography. SBE monthly reviewed. 4. Pap smear 2012. No Pap smear done today. No history of abnormal Pap smears previously. Plan repeat Pap smear next year 3 year interval. 5. Health maintenance. Baseline CBC comprehensive metabolic panel lipid profile  urinalysis ordered. Followup one year, sooner as needed.  Note: This document was prepared with digital dictation and possible smart phrase technology. Any transcriptional errors that result from this process are unintentional.   Dara Lords MD, 12:16 PM 08/18/2013

## 2013-08-18 NOTE — Patient Instructions (Signed)
Your normal lab results will be available on my chart. We will call you if there are any abnormal labs. Followup in one year for annual exam.

## 2013-08-19 LAB — URINALYSIS W MICROSCOPIC + REFLEX CULTURE
Bacteria, UA: NONE SEEN
Bilirubin Urine: NEGATIVE
Ketones, ur: NEGATIVE mg/dL
Protein, ur: NEGATIVE mg/dL
Urobilinogen, UA: 0.2 mg/dL (ref 0.0–1.0)

## 2014-03-03 ENCOUNTER — Other Ambulatory Visit: Payer: Self-pay

## 2014-03-03 DIAGNOSIS — Z1231 Encounter for screening mammogram for malignant neoplasm of breast: Secondary | ICD-10-CM

## 2014-03-27 ENCOUNTER — Ambulatory Visit
Admission: RE | Admit: 2014-03-27 | Discharge: 2014-03-27 | Disposition: A | Discharge status: Home / Self Care | Payer: Managed Care, Other (non HMO) | Source: Ambulatory Visit

## 2014-03-27 DIAGNOSIS — Z1231 Encounter for screening mammogram for malignant neoplasm of breast: Secondary | ICD-10-CM

## 2014-08-17 ENCOUNTER — Encounter: Payer: Self-pay | Admitting: Gynecology

## 2014-08-26 ENCOUNTER — Telehealth: Payer: Self-pay | Admitting: *Deleted

## 2014-08-26 NOTE — Telephone Encounter (Signed)
Pt called c/o that at times she has felt what feels like her heart beat in her neck. Pt said no pain, no dizziness, vomiting when this happens, started only this week. Pt does not have a PCP or cardiologist, pt has family history heart murmur. I explained to pt best to go to urgent care. Pt has annual on 09/02/14.

## 2014-09-02 ENCOUNTER — Encounter: Payer: Self-pay | Admitting: Gynecology

## 2014-09-02 ENCOUNTER — Other Ambulatory Visit (HOSPITAL_COMMUNITY)
Admission: RE | Admit: 2014-09-02 | Discharge: 2014-09-02 | Disposition: A | Discharge status: Home / Self Care | Payer: Managed Care, Other (non HMO) | Source: Ambulatory Visit | Attending: Gynecology | Admitting: Gynecology

## 2014-09-02 ENCOUNTER — Ambulatory Visit (INDEPENDENT_AMBULATORY_CARE_PROVIDER_SITE_OTHER): Payer: Managed Care, Other (non HMO) | Admitting: Gynecology

## 2014-09-02 VITALS — BP 120/80 | Ht 63.5 in | Wt 139.0 lb

## 2014-09-02 DIAGNOSIS — Z1151 Encounter for screening for human papillomavirus (HPV): Secondary | ICD-10-CM | POA: Insufficient documentation

## 2014-09-02 DIAGNOSIS — Z01419 Encounter for gynecological examination (general) (routine) without abnormal findings: Secondary | ICD-10-CM | POA: Diagnosis present

## 2014-09-02 LAB — LIPID PANEL
Cholesterol: 198 mg/dL (ref 0–200)
HDL: 50 mg/dL (ref >39)
LDL Cholesterol: 127 mg/dL — ABNORMAL HIGH (ref 0–99)
TRIGLYCERIDES: 107 mg/dL (ref <150)
Total CHOL/HDL Ratio: 4 Ratio
VLDL: 21 mg/dL (ref 0–40)

## 2014-09-02 LAB — CBC WITH DIFFERENTIAL/PLATELET
BASOS PCT: 0 % (ref 0–1)
Basophils Absolute: 0 10*3/uL (ref 0.0–0.1)
EOS PCT: 1 % (ref 0–5)
Eosinophils Absolute: 0.1 10*3/uL (ref 0.0–0.7)
HEMATOCRIT: 41.7 % (ref 36.0–46.0)
Hemoglobin: 14.1 g/dL (ref 12.0–15.0)
LYMPHS PCT: 23 % (ref 12–46)
Lymphs Abs: 1.4 10*3/uL (ref 0.7–4.0)
MCH: 30.5 pg (ref 26.0–34.0)
MCHC: 33.8 g/dL (ref 30.0–36.0)
MCV: 90.3 fL (ref 78.0–100.0)
MONO ABS: 0.5 10*3/uL (ref 0.1–1.0)
MONOS PCT: 8 % (ref 3–12)
MPV: 11.3 fL (ref 9.4–12.4)
NEUTROS ABS: 4.1 10*3/uL (ref 1.7–7.7)
Neutrophils Relative %: 68 % (ref 43–77)
Platelets: 180 10*3/uL (ref 150–400)
RBC: 4.62 MIL/uL (ref 3.87–5.11)
RDW: 12.1 % (ref 11.5–15.5)
WBC: 6 10*3/uL (ref 4.0–10.5)

## 2014-09-02 LAB — COMPREHENSIVE METABOLIC PANEL
ALBUMIN: 4.3 g/dL (ref 3.5–5.2)
ALT: 11 U/L (ref 0–35)
AST: 17 U/L (ref 0–37)
Alkaline Phosphatase: 33 U/L — ABNORMAL LOW (ref 39–117)
BUN: 11 mg/dL (ref 6–23)
CALCIUM: 9.2 mg/dL (ref 8.4–10.5)
CHLORIDE: 101 meq/L (ref 96–112)
CO2: 27 mEq/L (ref 19–32)
CREATININE: 0.72 mg/dL (ref 0.50–1.10)
GLUCOSE: 81 mg/dL (ref 70–99)
POTASSIUM: 4.3 meq/L (ref 3.5–5.3)
Sodium: 136 mEq/L (ref 135–145)
Total Bilirubin: 0.6 mg/dL (ref 0.2–1.2)
Total Protein: 6.9 g/dL (ref 6.0–8.3)

## 2014-09-02 LAB — TSH: TSH: 1.258 u[IU]/mL (ref 0.350–4.500)

## 2014-09-02 NOTE — Patient Instructions (Signed)
You may obtain a copy of any labs that were done today by logging onto MyChart as outlined in the instructions provided with your AVS (after visit summary). The office will not call with normal lab results but certainly if there are any significant abnormalities then we will contact you.   Health Maintenance, Female A healthy lifestyle and preventative care can promote health and wellness.  Maintain regular health, dental, and eye exams.  Eat a healthy diet. Foods like vegetables, fruits, whole grains, low-fat dairy products, and lean protein foods contain the nutrients you need without too many calories. Decrease your intake of foods high in solid fats, added sugars, and salt. Get information about a proper diet from your caregiver, if necessary.  Regular physical exercise is one of the most important things you can do for your health. Most adults should get at least 150 minutes of moderate-intensity exercise (any activity that increases your heart rate and causes you to sweat) each week. In addition, most adults need muscle-strengthening exercises on 2 or more days a week.   Maintain a healthy weight. The body mass index (BMI) is a screening tool to identify possible weight problems. It provides an estimate of body fat based on height and weight. Your caregiver can help determine your BMI, and can help you achieve or maintain a healthy weight. For adults 20 years and older:  A BMI below 18.5 is considered underweight.  A BMI of 18.5 to 24.9 is normal.  A BMI of 25 to 29.9 is considered overweight.  A BMI of 30 and above is considered obese.  Maintain normal blood lipids and cholesterol by exercising and minimizing your intake of saturated fat. Eat a balanced diet with plenty of fruits and vegetables. Blood tests for lipids and cholesterol should begin at age 61 and be repeated every 5 years. If your lipid or cholesterol levels are high, you are over 50, or you are a high risk for heart  disease, you may need your cholesterol levels checked more frequently.Ongoing high lipid and cholesterol levels should be treated with medicines if diet and exercise are not effective.  If you smoke, find out from your caregiver how to quit. If you do not use tobacco, do not start.  Lung cancer screening is recommended for adults aged 33 80 years who are at high risk for developing lung cancer because of a history of smoking. Yearly low-dose computed tomography (CT) is recommended for people who have at least a 30-pack-year history of smoking and are a current smoker or have quit within the past 15 years. A pack year of smoking is smoking an average of 1 pack of cigarettes a day for 1 year (for example: 1 pack a day for 30 years or 2 packs a day for 15 years). Yearly screening should continue until the smoker has stopped smoking for at least 15 years. Yearly screening should also be stopped for people who develop a health problem that would prevent them from having lung cancer treatment.  If you are pregnant, do not drink alcohol. If you are breastfeeding, be very cautious about drinking alcohol. If you are not pregnant and choose to drink alcohol, do not exceed 1 drink per day. One drink is considered to be 12 ounces (355 mL) of beer, 5 ounces (148 mL) of wine, or 1.5 ounces (44 mL) of liquor.  Avoid use of street drugs. Do not share needles with anyone. Ask for help if you need support or instructions about stopping  the use of drugs.  High blood pressure causes heart disease and increases the risk of stroke. Blood pressure should be checked at least every 1 to 2 years. Ongoing high blood pressure should be treated with medicines, if weight loss and exercise are not effective.  If you are 59 to 43 years old, ask your caregiver if you should take aspirin to prevent strokes.  Diabetes screening involves taking a blood sample to check your fasting blood sugar level. This should be done once every 3  years, after age 91, if you are within normal weight and without risk factors for diabetes. Testing should be considered at a younger age or be carried out more frequently if you are overweight and have at least 1 risk factor for diabetes.  Breast cancer screening is essential preventative care for women. You should practice "breast self-awareness." This means understanding the normal appearance and feel of your breasts and may include breast self-examination. Any changes detected, no matter how small, should be reported to a caregiver. Women in their 66s and 30s should have a clinical breast exam (CBE) by a caregiver as part of a regular health exam every 1 to 3 years. After age 101, women should have a CBE every year. Starting at age 100, women should consider having a mammogram (breast X-ray) every year. Women who have a family history of breast cancer should talk to their caregiver about genetic screening. Women at a high risk of breast cancer should talk to their caregiver about having an MRI and a mammogram every year.  Breast cancer gene (BRCA)-related cancer risk assessment is recommended for women who have family members with BRCA-related cancers. BRCA-related cancers include breast, ovarian, tubal, and peritoneal cancers. Having family members with these cancers may be associated with an increased risk for harmful changes (mutations) in the breast cancer genes BRCA1 and BRCA2. Results of the assessment will determine the need for genetic counseling and BRCA1 and BRCA2 testing.  The Pap test is a screening test for cervical cancer. Women should have a Pap test starting at age 57. Between ages 25 and 35, Pap tests should be repeated every 2 years. Beginning at age 37, you should have a Pap test every 3 years as long as the past 3 Pap tests have been normal. If you had a hysterectomy for a problem that was not cancer or a condition that could lead to cancer, then you no longer need Pap tests. If you are  between ages 50 and 76, and you have had normal Pap tests going back 10 years, you no longer need Pap tests. If you have had past treatment for cervical cancer or a condition that could lead to cancer, you need Pap tests and screening for cancer for at least 20 years after your treatment. If Pap tests have been discontinued, risk factors (such as a new sexual partner) need to be reassessed to determine if screening should be resumed. Some women have medical problems that increase the chance of getting cervical cancer. In these cases, your caregiver may recommend more frequent screening and Pap tests.  The human papillomavirus (HPV) test is an additional test that may be used for cervical cancer screening. The HPV test looks for the virus that can cause the cell changes on the cervix. The cells collected during the Pap test can be tested for HPV. The HPV test could be used to screen women aged 44 years and older, and should be used in women of any age  who have unclear Pap test results. After the age of 55, women should have HPV testing at the same frequency as a Pap test.  Colorectal cancer can be detected and often prevented. Most routine colorectal cancer screening begins at the age of 44 and continues through age 20. However, your caregiver may recommend screening at an earlier age if you have risk factors for colon cancer. On a yearly basis, your caregiver may provide home test kits to check for hidden blood in the stool. Use of a small camera at the end of a tube, to directly examine the colon (sigmoidoscopy or colonoscopy), can detect the earliest forms of colorectal cancer. Talk to your caregiver about this at age 86, when routine screening begins. Direct examination of the colon should be repeated every 5 to 10 years through age 13, unless early forms of pre-cancerous polyps or small growths are found.  Hepatitis C blood testing is recommended for all people born from [birth year] through 1965 and any  individual with known risks for hepatitis C.  Practice safe sex. Use condoms and avoid high-risk sexual practices to reduce the spread of sexually transmitted infections (STIs). Sexually active women aged 36 and younger should be checked for Chlamydia, which is a common sexually transmitted infection. Older women with new or multiple partners should also be tested for Chlamydia. Testing for other STIs is recommended if you are sexually active and at increased risk.  Osteoporosis is a disease in which the bones lose minerals and strength with aging. This can result in serious bone fractures. The risk of osteoporosis can be identified using a bone density scan. Women ages 20 and over and women at risk for fractures or osteoporosis should discuss screening with their caregivers. Ask your caregiver whether you should be taking a calcium supplement or vitamin D to reduce the rate of osteoporosis.  Menopause can be associated with physical symptoms and risks. Hormone replacement therapy is available to decrease symptoms and risks. You should talk to your caregiver about whether hormone replacement therapy is right for you.  Use sunscreen. Apply sunscreen liberally and repeatedly throughout the day. You should seek shade when your shadow is shorter than you. Protect yourself by wearing long sleeves, pants, a wide-brimmed hat, and sunglasses year round, whenever you are outdoors.  Notify your caregiver of new moles or changes in moles, especially if there is a change in shape or color. Also notify your caregiver if a mole is larger than the size of a pencil eraser.  Stay current with your immunizations. Document Released: 04/17/2011 Document Revised: 01/27/2013 Document Reviewed: 04/17/2011 [facility] Patient Information 2014 [employer].

## 2014-09-02 NOTE — Addendum Note (Signed)
Addended by: Dayna BarkerGARDNER, KIMBERLY K on: 09/02/2014 11:18 AM   Modules accepted: Orders, SmartSet

## 2014-09-02 NOTE — Progress Notes (Signed)
Jasmine [name] September 01, 1971 [id number]        43 y.o.  [id number] for annual exam.  Several issues noted below.  Past medical history,surgical history, problem list, medications, allergies, family history and social history were all reviewed and documented as reviewed in the EPIC chart.  ROS:  12 system ROS performed with pertinent positives and negatives included in the history, assessment and plan.   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 Skin: Grossly normal HEENT: Without gross lesions.  No cervical or supraclavicular adenopathy. Thyroid normal.  Lungs:  Clear without wheezing, rales or rhonchi Cardiac: RR, without RMG Abdominal:  Soft, nontender, without masses, guarding, rebound, organomegaly or hernia Breasts:  Examined lying and sitting without masses, retractions, discharge or axillary adenopathy. Pelvic:  Ext/BUS/vagina normal  Cervix normal. Pap/HPV  Uterus anteverted, normal size, shape and contour, midline and mobile nontender   Adnexa  Without masses or tenderness    Anus and perineum  Normal   Rectovaginal  Normal sphincter tone without palpated masses or tenderness.    Assessment/Plan:  43 y.o. [id number] female for annual exam with regular menses, condom contraception.   1. Contraception. Again discussed failure risks with condoms and she is comfortable with this. The availability of plan B reviewed. 2. Palpitations. Patient had an episode last week of feeling her heart race. Lasted several hours then resolved. No chest pain lightheadedness or other symptoms. She called the office and was recommended to see urgent care but never did. Recommend monitoring and if it recurs to follow up in urgent care/ER for evaluation while it is happening.  Check TSH today. 3. Pap smear 2012. Paps us HPV  today. No history of significant abnormal Pap smears previously. Plan repeat Pap smear at 3-5 year interval assuming this Pap smear is normal. 4. Mammography 03/2014. Continue with annual mammography. SBE monthly reviewed. 5. Health maintenance.  CBC, comprehensive metabolic panel, lipid profile, urinalysis, TSH ordered. Follow up in one year, sooner if palpitations return.     Dara LordsFONTAINE,[name] P MD, 10:42 AM 09/02/2014

## 2014-09-03 ENCOUNTER — Other Ambulatory Visit: Payer: Self-pay | Admitting: Gynecology

## 2014-09-03 DIAGNOSIS — E7800 Pure hypercholesterolemia, unspecified: Secondary | ICD-10-CM

## 2014-09-03 LAB — URINALYSIS W MICROSCOPIC + REFLEX CULTURE
BACTERIA UA: NONE SEEN
BILIRUBIN URINE: NEGATIVE
CASTS: NONE SEEN
Crystals: NONE SEEN
GLUCOSE, UA: NEGATIVE mg/dL
HGB URINE DIPSTICK: NEGATIVE
KETONES UR: NEGATIVE mg/dL
Leukocytes, UA: NEGATIVE
Nitrite: NEGATIVE
PH: 6 (ref 5.0–8.0)
Protein, ur: NEGATIVE mg/dL
Specific Gravity, Urine: 1.005 (ref 1.005–1.030)
Squamous Epithelial / LPF: NONE SEEN
Urobilinogen, UA: 0.2 mg/dL (ref 0.0–1.0)

## 2014-09-03 LAB — CYTOLOGY - PAP

## 2014-11-13 ENCOUNTER — Telehealth: Payer: Self-pay | Admitting: *Deleted

## 2014-11-13 NOTE — Telephone Encounter (Signed)
Pt called c/o bleeding since LMP 10/21/14. I left message for pt to call.

## 2014-11-13 NOTE — Telephone Encounter (Signed)
Pt aware you are out of the office, called c/o brownish dark blood, LMP:10/21/14 since her last cycle. Not enough to be cycle, but spotting, she is thinking this maybe related to her age. Her cycle will be starting again in february and asked me to run this information by you if you think she should be worried? Has some slight bloating, but not pain or other symptoms. Please advise

## 2014-11-17 NOTE — Telephone Encounter (Signed)
Left the below on pt voicemail. 

## 2014-11-17 NOTE — Telephone Encounter (Signed)
Recommend monitor. If continues irregular after 1 cycle then office visit.

## 2014-12-18 ENCOUNTER — Telehealth: Payer: Self-pay

## 2014-12-18 NOTE — Telephone Encounter (Signed)
Patient called in voice mail complaining of "my bottom is itching". She went on to say she discussed hemorrhoids with Dr. Velvet BatheF at [facility] and thinks it could be that. She asked if this is something we could help her with. I called her back and told her Dr. Velvet BatheF could examine her and assess.  I recommended she call and schedule office visit.

## 2014-12-22 ENCOUNTER — Ambulatory Visit (INDEPENDENT_AMBULATORY_CARE_PROVIDER_SITE_OTHER): Payer: Managed Care, Other (non HMO) | Admitting: Women's Health

## 2014-12-22 ENCOUNTER — Encounter: Payer: Self-pay | Admitting: Women's Health

## 2014-12-22 VITALS — BP 124/80 | Ht 64.0 in | Wt 141.0 lb

## 2014-12-22 DIAGNOSIS — B373 Candidiasis of vulva and vagina: Secondary | ICD-10-CM | POA: Diagnosis not present

## 2014-12-22 DIAGNOSIS — B3731 Acute candidiasis of vulva and vagina: Secondary | ICD-10-CM

## 2014-12-22 DIAGNOSIS — K640 First degree hemorrhoids: Secondary | ICD-10-CM

## 2014-12-22 LAB — WET PREP FOR TRICH, YEAST, CLUE
CLUE CELLS WET PREP: NONE SEEN
Trich, Wet Prep: NONE SEEN

## 2014-12-22 MED ORDER — HYDROCORTISONE ACE-PRAMOXINE 2.5-1 % RE CREA: 1.0000 "application " | TOPICAL_CREAM | Freq: Three times a day (TID) | RECTAL | Status: DC

## 2014-12-22 MED ORDER — FLUCONAZOLE 150 MG PO TABS: 150.0000 mg | ORAL_TABLET | Freq: Once | ORAL | Status: DC

## 2014-12-22 NOTE — Patient Instructions (Signed)
Monilial Vaginitis Vaginitis in a soreness, swelling and redness (inflammation) of the vagina and vulva. Monilial vaginitis is not a sexually transmitted infection. CAUSES  Yeast vaginitis is caused by yeast (candida) that is normally found in your vagina. With a yeast infection, the candida has overgrown in number to a point that upsets the chemical balance. SYMPTOMS   White, thick vaginal discharge.  Swelling, itching, redness and irritation of the vagina and possibly the lips of the vagina (vulva).  Burning or painful urination.  Painful intercourse. DIAGNOSIS  Things that may contribute to monilial vaginitis are:  Postmenopausal and virginal states.  Pregnancy.  Infections.  Being tired, sick or stressed, especially if you had monilial vaginitis in the past.  Diabetes. Good control will help lower the chance.  Birth control pills.  Tight fitting garments.  Using bubble bath, feminine sprays, douches or deodorant tampons.  Taking certain medications that kill germs (antibiotics).  Sporadic recurrence can occur if you become ill. TREATMENT  Your caregiver will give you medication.  There are several kinds of anti monilial vaginal creams and suppositories specific for monilial vaginitis. For recurrent yeast infections, use a suppository or cream in the vagina 2 times a week, or as directed.  Anti-monilial or steroid cream for the itching or irritation of the vulva may also be used. Get your caregiver's permission.  Painting the vagina with methylene blue solution may help if the monilial cream does not work.  Eating yogurt may help prevent monilial vaginitis. HOME CARE INSTRUCTIONS   Finish all medication as prescribed.  Do not have sex until treatment is completed or after your caregiver tells you it is okay.  Take warm sitz baths.  Do not douche.  Do not use tampons, especially scented ones.  Wear cotton underwear.  Avoid tight pants and panty  hose.  Tell your sexual partner that you have a yeast infection. They should go to their caregiver if they have symptoms such as mild rash or itching.  Your sexual partner should be treated as well if your infection is difficult to eliminate.  Practice safer sex. Use condoms.  Some vaginal medications cause latex condoms to fail. Vaginal medications that harm condoms are:  Cleocin cream.  Butoconazole (Femstat).  Terconazole (Terazol) vaginal suppository.  Miconazole (Monistat) (may be purchased over the counter). SEEK MEDICAL CARE IF:   You have a temperature by mouth above 102 F (38.9 C).  The infection is getting worse after 2 days of treatment.  The infection is not getting better after 3 days of treatment.  You develop blisters in or around your vagina.  You develop vaginal bleeding, and it is not your menstrual period.  You have pain when you urinate.  You develop intestinal problems.  You have pain with sexual intercourse. Document Released: 07/12/2005 Document Revised: 12/25/2011 Document Reviewed: 03/26/2009 [facility] Patient Information 2015 [place]. This information is not intended to replace advice given to you by your health care provider. Make sure you discuss any questions you have with your health care provider. Hemorrhoids Hemorrhoids are swollen veins around the rectum or anus. There are two types of hemorrhoids:   Internal hemorrhoids. These occur in the veins just inside the rectum. They may poke through to the outside and become irritated and painful.  External hemorrhoids. These occur in the veins outside the anus and can be felt as a painful swelling or hard lump near the anus. CAUSES  Pregnancy.   Obesity.   Constipation or diarrhea.  Straining to have a bowel movement.   Sitting for long periods on the toilet.  Heavy lifting or other activity that caused you to strain.  Anal intercourse. SYMPTOMS   Pain.   Anal  itching or irritation.   Rectal bleeding.   Fecal leakage.   Anal swelling.   One or more lumps around the anus.  DIAGNOSIS  Your caregiver may be able to diagnose hemorrhoids by visual examination. Other examinations or tests that may be performed include:   Examination of the rectal area with a gloved hand (digital rectal exam).   Examination of anal canal using a small tube (scope).   A blood test if you have lost a significant amount of blood.  A test to look inside the colon (sigmoidoscopy or colonoscopy). TREATMENT Most hemorrhoids can be treated at home. However, if symptoms do not seem to be getting better or if you have a lot of rectal bleeding, your caregiver may perform a procedure to help make the hemorrhoids get smaller or remove them completely. Possible treatments include:   Placing a rubber band at the base of the hemorrhoid to cut off the circulation (rubber band ligation).   Injecting a chemical to shrink the hemorrhoid (sclerotherapy).   Using a tool to burn the hemorrhoid (infrared light therapy).   Surgically removing the hemorrhoid (hemorrhoidectomy).   Stapling the hemorrhoid to block blood flow to the tissue (hemorrhoid stapling).  HOME CARE INSTRUCTIONS   Eat foods with fiber, such as whole grains, beans, nuts, fruits, and vegetables. Ask your doctor about taking products with added fiber in them (fibersupplements).  Increase fluid intake. Drink enough water and fluids to keep your urine clear or pale yellow.   Exercise regularly.   Go to the bathroom when you have the urge to have a bowel movement. Do not wait.   Avoid straining to have bowel movements.   Keep the anal area dry and clean. Use wet toilet paper or moist towelettes after a bowel movement.   Medicated creams and suppositories may be used or applied as directed.   Only take over-the-counter or prescription medicines as directed by your caregiver.   Take warm  sitz baths for 15-20 minutes, 3-4 times a day to ease pain and discomfort.   Place ice packs on the hemorrhoids if they are tender and swollen. Using ice packs between sitz baths may be helpful.   Put ice in a plastic bag.   Place a towel between your skin and the bag.   Leave the ice on for 15-20 minutes, 3-4 times a day.   Do not use a donut-shaped pillow or sit on the toilet for long periods. This increases blood pooling and pain.  SEEK MEDICAL CARE IF:  You have increasing pain and swelling that is not controlled by treatment or medicine.  You have uncontrolled bleeding.  You have difficulty or you are unable to have a bowel movement.  You have pain or inflammation outside the area of the hemorrhoids. MAKE SURE YOU:  Understand these instructions.  Will watch your condition.  Will get help right away if you are not doing well or get worse. Document Released: 09/29/2000 Document Revised: 09/18/2012 Document Reviewed: 08/06/2012 [facility] Patient Information 2015 [place], [place]. This information is not intended to replace advice given to you by your health care provider. Make sure you discuss any questions you have with your health care provider.

## 2014-12-22 NOTE — Progress Notes (Signed)
Patient ID: Jasmine LingoJuliet R [name], female   DOB: 07-10-1971, 44 y.o.   MRN: [id number] Presents with complaint of rectal itching and occasional vaginal itching, symptoms intermittent for the past couple of months. Denies constipation or rectal bleeding. Cycles monthly/condoms. Denies urinary symptoms, abdominal pain or fever.  Exam: Appears well. External genitalia mild erythema at introitus,  Rectum- anterior 1 cm nonthrombosed hemorrhoid. Speculum exam scant white discharge, wet prep positive for yeast. Bimanual no CMT or tenderness, rectal exam one small hemorrhoid, no visible bleeding.  Yeast vaginitis Small external hemorrhoid/rectal itching  Plan: Diflucan 150 by mouth 1 dose with refill. Instructed to call if no relief. Analpram 2.5/1% cream 3 times daily as needed, instructed to call if continued symptoms.

## 2014-12-22 NOTE — Addendum Note (Signed)
Addended by: Kem ParkinsonBARNES, Seriah Brotzman on: 12/22/2014 02:33 PM   Modules accepted: Orders

## 2015-03-05 ENCOUNTER — Other Ambulatory Visit: Payer: Self-pay

## 2015-03-05 DIAGNOSIS — Z1231 Encounter for screening mammogram for malignant neoplasm of breast: Secondary | ICD-10-CM

## 2015-04-09 ENCOUNTER — Ambulatory Visit
Admission: RE | Admit: 2015-04-09 | Discharge: 2015-04-09 | Disposition: A | Discharge status: Home / Self Care | Payer: Managed Care, Other (non HMO) | Source: Ambulatory Visit

## 2015-04-09 DIAGNOSIS — Z1231 Encounter for screening mammogram for malignant neoplasm of breast: Secondary | ICD-10-CM

## 2015-09-07 ENCOUNTER — Encounter: Payer: Managed Care, Other (non HMO) | Admitting: Gynecology

## 2015-09-08 ENCOUNTER — Encounter: Payer: Self-pay | Admitting: Gynecology

## 2015-09-08 ENCOUNTER — Ambulatory Visit (INDEPENDENT_AMBULATORY_CARE_PROVIDER_SITE_OTHER): Payer: Managed Care, Other (non HMO) | Admitting: Gynecology

## 2015-09-08 VITALS — BP 120/76 | Ht 64.0 in | Wt 141.0 lb

## 2015-09-08 DIAGNOSIS — Z13220 Encounter for screening for lipoid disorders: Secondary | ICD-10-CM | POA: Diagnosis not present

## 2015-09-08 DIAGNOSIS — Z01419 Encounter for gynecological examination (general) (routine) without abnormal findings: Secondary | ICD-10-CM

## 2015-09-08 LAB — LIPID PANEL
Cholesterol: 160 mg/dL (ref 125–200)
HDL: 50 mg/dL (ref >=46)
LDL Cholesterol: 97 mg/dL (ref <130)
Total CHOL/HDL Ratio: 3.2 Ratio (ref <=5.0)
Triglycerides: 66 mg/dL (ref <150)
VLDL: 13 mg/dL (ref <30)

## 2015-09-08 LAB — COMPREHENSIVE METABOLIC PANEL
ALK PHOS: 34 U/L (ref 33–115)
ALT: 9 U/L (ref 6–29)
AST: 14 U/L (ref 10–30)
Albumin: 3.8 g/dL (ref 3.6–5.1)
BILIRUBIN TOTAL: 0.7 mg/dL (ref 0.2–1.2)
BUN: 11 mg/dL (ref 7–25)
CHLORIDE: 104 mmol/L (ref 98–110)
CO2: 27 mmol/L (ref 20–31)
CREATININE: 0.63 mg/dL (ref 0.50–1.10)
Calcium: 8.9 mg/dL (ref 8.6–10.2)
GLUCOSE: 67 mg/dL (ref 65–99)
Potassium: 4.4 mmol/L (ref 3.5–5.3)
SODIUM: 137 mmol/L (ref 135–146)
Total Protein: 6.3 g/dL (ref 6.1–8.1)

## 2015-09-08 LAB — CBC WITH DIFFERENTIAL/PLATELET
BASOS ABS: 0 10*3/uL (ref 0.0–0.1)
Basophils Relative: 0 % (ref 0–1)
EOS PCT: 2 % (ref 0–5)
Eosinophils Absolute: 0.1 10*3/uL (ref 0.0–0.7)
HEMATOCRIT: 38 % (ref 36.0–46.0)
HEMOGLOBIN: 12.7 g/dL (ref 12.0–15.0)
LYMPHS ABS: 1.3 10*3/uL (ref 0.7–4.0)
LYMPHS PCT: 28 % (ref 12–46)
MCH: 29.9 pg (ref 26.0–34.0)
MCHC: 33.4 g/dL (ref 30.0–36.0)
MCV: 89.4 fL (ref 78.0–100.0)
MPV: 11.2 fL (ref 8.6–12.4)
Monocytes Absolute: 0.2 10*3/uL (ref 0.1–1.0)
Monocytes Relative: 5 % (ref 3–12)
NEUTROS ABS: 3.1 10*3/uL (ref 1.7–7.7)
Neutrophils Relative %: 65 % (ref 43–77)
PLATELETS: 182 10*3/uL (ref 150–400)
RBC: 4.25 MIL/uL (ref 3.87–5.11)
RDW: 12.3 % (ref 11.5–15.5)
WBC: 4.8 10*3/uL (ref 4.0–10.5)

## 2015-09-08 NOTE — Progress Notes (Signed)
Jasmine [name] Dec 14, 1970 [id number]        44 y.o.  [id number]  Patient's last menstrual period was 09/08/2015. for annual exam.  Doing well.  Past medical history,surgical history, problem list, medications, allergies, family history and social history were all reviewed and documented as reviewed in the EPIC chart.  ROS:  Performed with pertinent positives and negatives included in the history, assessment and plan.   Additional significant findings :  none   Exam: [name] personassistant Filed Vitals:   09/08/15 0945  BP: 120/76  Height: 5\' 4"  (1.626 m)  Weight: 141 lb (63.957 kg)   General appearance:  Normal affect, orientation and appearance. Skin: Grossly normal HEENT: Without gross lesions.  No cervical or supraclavicular adenopathy. Thyroid normal.  Lungs:  Clear without wheezing, rales or rhonchi Cardiac: RR, without RMG Abdominal:  Soft, nontender, without masses, guarding, rebound, organomegaly or hernia Breasts:  Examined lying and sitting without masses, retractions, discharge or axillary adenopathy. Pelvic:  Ext/BUS/vagina normal with menses flow  Cervix normal with menses flow  Uterus anteverted, normal size, shape and contour, midline and mobile nontender   Adnexa  Without masses or tenderness    Anus and perineum  Normal   Rectovaginal  Normal sphincter tone without palpated masses or tenderness.    Assessment/Plan:  44 y.o. [id number] female for annual exam  With regular menses, condom contraception.   1. Contraception. I again reviewed the issues and risks of failure with condoms. Patient clearly understands and does not want to do anything different accepting the risk of failure. Availability of Plan B has been discussed with her. 2. History of hemorrhoid earlier this year. Now resolved and doing well. 3. Mammography 03/2015. Continue with annual mammography. SBE monthly reviewed. 4. Pap smear/HPV 2015 negative. No Pap smear done today.  No history of significant abnormal  Pap smears. 5. Health maintenance. Baseline CBC comprehensive metabolic panel lipid profile urinalysis ordered. Follow up in one year, sooner as needed.   Dara LordsFONTAINE,Andretta Ergle P MD, 10:06 AM 09/08/2015

## 2015-09-08 NOTE — Patient Instructions (Signed)

## 2015-09-09 LAB — URINALYSIS W MICROSCOPIC + REFLEX CULTURE
BILIRUBIN URINE: NEGATIVE
Bacteria, UA: NONE SEEN [HPF]
CRYSTALS: NONE SEEN [HPF]
Casts: NONE SEEN [LPF]
GLUCOSE, UA: NEGATIVE
Hgb urine dipstick: NEGATIVE
KETONES UR: NEGATIVE
Leukocytes, UA: NEGATIVE
NITRITE: NEGATIVE
PH: 6.5 (ref 5.0–8.0)
Protein, ur: NEGATIVE
RBC / HPF: NONE SEEN RBC/HPF (ref <=2)
SPECIFIC GRAVITY, URINE: 1.011 (ref 1.001–1.035)
Squamous Epithelial / LPF: NONE SEEN [HPF] (ref <=5)
WBC UA: NONE SEEN WBC/HPF (ref <=5)
Yeast: NONE SEEN [HPF]

## 2015-11-29 ENCOUNTER — Telehealth: Payer: Self-pay | Admitting: *Deleted

## 2015-11-29 DIAGNOSIS — N926 Irregular menstruation, unspecified: Secondary | ICD-10-CM

## 2015-11-29 NOTE — Telephone Encounter (Signed)
Pt called stating in last 6 months she has had prolonged bleeding with cycle,states she cycle normally would last about 3 days the max 4. Now stating lasting x 1 week with bloating as well. No heavy bleeding, dark brown blood, wears panty liner, states in 2013 she had SHGM due to this. She was unsure if normal due to age if repeat SHGM would be needed. She asked what you recommendations if ultrasound should be order or OV only? Please advise

## 2015-11-30 NOTE — Addendum Note (Signed)
Addended by: Aura Camps on: 11/30/2015 08:10 AM   Modules accepted: Orders

## 2015-11-30 NOTE — Telephone Encounter (Signed)
Recommend sonohystogram.  I can exam her then.

## 2015-11-30 NOTE — Telephone Encounter (Signed)
Will have front desk contact pt to schedule.

## 2015-12-06 ENCOUNTER — Other Ambulatory Visit: Payer: Self-pay | Admitting: Gynecology

## 2015-12-06 DIAGNOSIS — N939 Abnormal uterine and vaginal bleeding, unspecified: Secondary | ICD-10-CM

## 2015-12-08 ENCOUNTER — Telehealth: Payer: Self-pay | Admitting: Gynecology

## 2015-12-08 NOTE — Telephone Encounter (Signed)
12/08/15-I LM VM cell for pt that her [employer] ins will cover the sonohysterogram & bx if needed under her $30 copay. [id number]

## 2015-12-17 ENCOUNTER — Ambulatory Visit (INDEPENDENT_AMBULATORY_CARE_PROVIDER_SITE_OTHER): Payer: Managed Care, Other (non HMO) | Admitting: Gynecology

## 2015-12-17 ENCOUNTER — Other Ambulatory Visit: Payer: Self-pay | Admitting: Gynecology

## 2015-12-17 ENCOUNTER — Encounter: Payer: Self-pay | Admitting: Gynecology

## 2015-12-17 ENCOUNTER — Ambulatory Visit (INDEPENDENT_AMBULATORY_CARE_PROVIDER_SITE_OTHER): Payer: Managed Care, Other (non HMO)

## 2015-12-17 VITALS — BP 116/74

## 2015-12-17 DIAGNOSIS — N858 Other specified noninflammatory disorders of uterus: Secondary | ICD-10-CM

## 2015-12-17 DIAGNOSIS — N926 Irregular menstruation, unspecified: Secondary | ICD-10-CM | POA: Diagnosis not present

## 2015-12-17 DIAGNOSIS — N939 Abnormal uterine and vaginal bleeding, unspecified: Secondary | ICD-10-CM

## 2015-12-17 DIAGNOSIS — N841 Polyp of cervix uteri: Secondary | ICD-10-CM

## 2015-12-17 NOTE — Patient Instructions (Signed)
Follow up for colposcopy appt as scheduled

## 2015-12-17 NOTE — Progress Notes (Addendum)
Jasmine [name] 1971/01/07 [id number]        45 y.o.  [id number] presents for sonohysterogram. Patient notes over the past 6 months or so her periods have lasted longer up to a week where they were only 3 days with more bloating. Also has bled on and off over the last several weeks.  Past medical history,surgical history, problem list, medications, allergies, family history and social history were all reviewed and documented in the EPIC chart.  Directed ROS with pertinent positives and negatives documented in the history of present illness/assessment and plan.  Exam: [place] assistant Filed Vitals:   12/17/15 1023  BP: 116/74   General appearance:  Normal Abdomen soft nontender without masses guarding rebound Pelvic external BUS vagina  Normal. Cervix normal. Uterus grossly normal size midline mobile nontender. Adnexa without masses or tenderness.  Ultrasound shows uterus overall normal in size and echotexture. Endometrial echo of 7.5 mm. Right and left ovaries normal. Cervix canal fluid filled with solid mass 24 x 19 x 25 mm consistent with  Endocervical polyp. Cul-de-sac negative.  Sonohysterogram performed under sterile technique requiring single tooth tenaculum anterior lip stabilization and slight cervical dilatation for placement of the catheter. Good endometrial distention with no abnormalities.  Endometrial sample taken. Patient tolerated well.  Assessment/Plan:  45 y.o. [id number] with history as above. Ultrasound shows endocervical polyp. Reviewed with patient.  Will follow up for her endometrial biopsy results. Will schedule a colposcopy appointment and we will attempt to dilate her cervix under paracervical block and retrieve the polyp. If unable to do so then discussed outpatient surgical D&C with hysteroscopy. Patient agrees with the plan and will follow up and schedule.    Dara LordsFONTAINE,Nyzaiah Kai P MD, 11:05 AM 12/17/2015

## 2015-12-22 ENCOUNTER — Ambulatory Visit: Payer: Managed Care, Other (non HMO) | Admitting: Gynecology

## 2015-12-22 ENCOUNTER — Other Ambulatory Visit: Payer: Managed Care, Other (non HMO)

## 2015-12-31 ENCOUNTER — Other Ambulatory Visit: Payer: Self-pay | Admitting: Gynecology

## 2015-12-31 ENCOUNTER — Telehealth: Payer: Self-pay

## 2015-12-31 MED ORDER — FLUCONAZOLE 150 MG PO TABS: 150.0000 mg | ORAL_TABLET | Freq: Once | ORAL | Status: DC

## 2015-12-31 NOTE — Telephone Encounter (Signed)
Okay for Diflucan 150 mg 1 dose 

## 2015-12-31 NOTE — Telephone Encounter (Signed)
Patient advised.

## 2015-12-31 NOTE — Telephone Encounter (Signed)
I'm okay with her spotting.

## 2015-12-31 NOTE — Telephone Encounter (Signed)
Patient has C&B scheduled next week. Period has started and she said she may still be spotting then. I recommended she reschedule until she is not bleeding.  She said that may be hard to do because she spots up to a week sometimes after her period and they have been irregular.  She said you are looking for a polyp or something abnormal on her cervix. She wanted me to check with you and see if okay to still do if she is spotting.

## 2015-12-31 NOTE — Telephone Encounter (Signed)
Treated by PCP 2-3 weeks ago for sinus infection. They gave her a Diflucan to take after antibiotic and she did. However, she is having symptoms now of a vaginal yeast infection and wondered if you would send a Diflucan to pharmacy. (She is scheduled for C&B next week.)

## 2016-01-05 ENCOUNTER — Ambulatory Visit (INDEPENDENT_AMBULATORY_CARE_PROVIDER_SITE_OTHER): Payer: Managed Care, Other (non HMO) | Admitting: Gynecology

## 2016-01-05 ENCOUNTER — Encounter: Payer: Self-pay | Admitting: Gynecology

## 2016-01-05 VITALS — BP 120/76

## 2016-01-05 DIAGNOSIS — N926 Irregular menstruation, unspecified: Secondary | ICD-10-CM

## 2016-01-05 DIAGNOSIS — N882 Stricture and stenosis of cervix uteri: Secondary | ICD-10-CM | POA: Diagnosis not present

## 2016-01-05 DIAGNOSIS — N841 Polyp of cervix uteri: Secondary | ICD-10-CM | POA: Diagnosis not present

## 2016-01-05 NOTE — Patient Instructions (Signed)
Office will call you to arrange for ultrasound and surgery.

## 2016-01-05 NOTE — Progress Notes (Signed)
    Jasmine LingoJuliet R [name] 1971-08-19 [id number]        45 y.o.  [id number] Presents for cervical dilatation and attempt to retrieve cervical polyp. Has had heavier and longer menses with some intermenstrual spotting. Senna hysterogram showed normal endometrial cavity but did show a fluid-filled cervical canal with a solid mass 24 x 19 x 25 mm consistent with a polyp.  Past medical history,surgical history, problem list, medications, allergies, family history and social history were all reviewed and documented in the EPIC chart.  Directed ROS with pertinent positives and negatives documented in the history of present illness/assessment and plan.  Exam: Kennon PortelaKim [place] assistant Filed Vitals:   01/05/16 1001  BP: 120/76   General appearance:  Normal Abdomen soft nontender without masses guarding rebound Pelvic external BUS vagina normal. Cervix grossly normal noting a pinpoint opening with slight menses flow. Uterus anteverted normal size midline mobile nontender. Adnexa without masses or tenderness  Procedure: The cervix was cleansed with antiseptic and the anterior lip of the cervix was infiltrated with 1% lidocaine.  The cervix was grasped with a single-tooth tenaculum and a paracervical block using 1% lidocaine was placed, total of 8 cc. The cervix was dilated with a disposable dilator and the cervical canal was blindly curetted without retrieval of tissue. [place] forceps were then introduced open and closed blindly and again without retrieval of tissue.  Assessment/Plan:  45 y.o. [id number] with heavier and more prolonged menses. Generous endocervical polyp. Question as to whether it's working as a ball valve type situation because she notes that she will have a menses flow for 5 days or so it will taper off and then another flow for a day or 2. Recommend proceeding with hysteroscopy D&C for retrieval of this polyp under direct visualization. I reviewed with the patient was involved with the procedure to  include the intraoperative and postoperative courses. Risks to include infection, antibiotics, hemorrhage necessitating transfusion, damage to internal structures to include vagina cervix uterus, her for a patient with internal organ damage including bowel bladder ureters vessels and nerves necessitating major exploratory reparative surgeries and future reparative surgeries including ostomy formation bowel resection and other reparative surgeries. We'll plan ultrasound to relook at the cervical canal preoperatively to make sure this is a persistent defect a week or so before the procedure. The patient agrees with the plan.    Dara LordsFONTAINE,Raine Blodgett P MD, 10:22 AM 01/05/2016

## 2016-01-12 ENCOUNTER — Telehealth: Payer: Self-pay

## 2016-01-12 ENCOUNTER — Other Ambulatory Visit: Payer: Self-pay | Admitting: Gynecology

## 2016-01-12 DIAGNOSIS — N841 Polyp of cervix uteri: Secondary | ICD-10-CM

## 2016-01-12 MED ORDER — MISOPROSTOL 200 MCG PO TABS: ORAL_TABLET | ORAL | Status: DC

## 2016-01-12 NOTE — Telephone Encounter (Signed)
Order received and I spoke with patient and [facility]  She will come for pre op u/s and visit with Dr. Velvet BatheF on 03/01/16.  We discussed her insurance benefits and her estimated surgery prepayment amount. I will send her a financial letter. We discussed the need for Cytotec tab intravaginally the night before and I sent that in for her as well.  She will call me if any questions.

## 2016-01-12 NOTE — Telephone Encounter (Signed)
Patient called because she was in last week and she is waiting to hear regarding surgery scheduling for Hyst D&C. Please send me your order sheet. Thank you

## 2016-01-12 NOTE — Telephone Encounter (Signed)
Encounter was already opened.

## 2016-01-17 ENCOUNTER — Telehealth: Payer: Self-pay | Admitting: *Deleted

## 2016-01-17 ENCOUNTER — Encounter: Payer: Self-pay | Admitting: Gynecology

## 2016-01-17 MED ORDER — FLUCONAZOLE 150 MG PO TABS: 150.0000 mg | ORAL_TABLET | Freq: Once | ORAL | Status: DC

## 2016-01-17 NOTE — Telephone Encounter (Signed)
Pt aware, Rx sent. 

## 2016-01-17 NOTE — Telephone Encounter (Signed)
Pt called c/o white discharge, internal and external vaginal itching asked if diflucan tablet could be sent to pharmacy? Please advise

## 2016-01-17 NOTE — Telephone Encounter (Signed)
Okay for Diflucan 150 mg 1 dose 

## 2016-01-24 ENCOUNTER — Encounter (HOSPITAL_COMMUNITY): Payer: Self-pay | Admitting: *Deleted

## 2016-02-23 ENCOUNTER — Ambulatory Visit (INDEPENDENT_AMBULATORY_CARE_PROVIDER_SITE_OTHER): Payer: Managed Care, Other (non HMO) | Admitting: Gynecology

## 2016-02-23 ENCOUNTER — Ambulatory Visit (INDEPENDENT_AMBULATORY_CARE_PROVIDER_SITE_OTHER): Payer: Managed Care, Other (non HMO)

## 2016-02-23 ENCOUNTER — Encounter: Payer: Self-pay | Admitting: Gynecology

## 2016-02-23 VITALS — BP 118/74

## 2016-02-23 DIAGNOSIS — N841 Polyp of cervix uteri: Secondary | ICD-10-CM

## 2016-02-23 DIAGNOSIS — L290 Pruritus ani: Secondary | ICD-10-CM | POA: Diagnosis not present

## 2016-02-23 NOTE — Patient Instructions (Signed)
Follow up in November/December 2017 for annual exam when due. Follow up sooner if irregular bleeding returns.

## 2016-02-23 NOTE — Progress Notes (Signed)
    Ula LingoJuliet R Bassinger 1970/11/27 [id number]        45 y.o.  [id number] presents for preoperative consult upcoming hysteroscopy D&C due to large endocervical polyps. Also has ultrasound just to relook at this area preoperatively. Complaining of history of perianal itching which was transient now gone but thought she felt a lump in her perianal area. Not painful. No bleeding.  Past medical history,surgical history, problem list, medications, allergies, family history and social history were all reviewed and documented in the EPIC chart.  Directed ROS with pertinent positives and negatives documented in the history of present illness/assessment and plan.  Exam: Kennon PortelaKim [place] assistant Filed Vitals:   02/23/16 1157  BP: 118/74   General appearance:  Normal Abdomen soft nontender without masses guarding rebound Pelvic external BUS vagina normal. Cervix normal. Uterus normal size midline mobile nontender. Adnexa without masses or tenderness. Rectal exam normal without evidence of hemorrhoids or masses.  Ultrasound shows uterus normal size and echotexture. Endometrial echo 6.4 mm. Right and left ovaries grossly normal. Prior cervical mass not seen. Cul-de-sac negative.  Assessment/Plan:  45 y.o. [id number] with resolution of the prior cervical polyps. I did dilate her and curette in the cervical canal vigorously in March and wonder if I did not crush/remove them at that time. Her pathology did show endometrium but no evidence of endocervical polyps. She was having prolonged menses as the reason to initiate the sonohysterogram. She notes that her menses have now been regular. As there is no evidence of the prior polyps and her menses are regular will go ahead and cancel the hysteroscopy D&C. She'll call if she has any irregular bleeding but otherwise follow up in the late fall for annual exam when she is due.  Patient also complaining of history of perianal itching which now is resolved but she thought she  felt a bump in her peri-anal region. Her exam is totally normal and I suspect she may have had a small fissure which had a little bit of swelling associated with it that she was feeling but now this is resolved. Regardless she'll follow expectantly and if she has return of itching or any palpable abnormality should represent for evaluation.    Dara LordsFONTAINE,Augustina Braddock P MD, 12:11 PM 02/23/2016

## 2016-02-29 ENCOUNTER — Ambulatory Visit: Admit: 2016-02-29 | Payer: Managed Care, Other (non HMO) | Admitting: Gynecology

## 2016-02-29 SURGERY — DILATATION & CURETTAGE/HYSTEROSCOPY WITH MYOSURE
Anesthesia: General

## 2016-03-01 ENCOUNTER — Ambulatory Visit: Payer: Managed Care, Other (non HMO) | Admitting: Gynecology

## 2016-03-01 ENCOUNTER — Other Ambulatory Visit: Payer: Managed Care, Other (non HMO)

## 2016-04-11 ENCOUNTER — Other Ambulatory Visit: Payer: Self-pay | Admitting: Gynecology

## 2016-04-11 DIAGNOSIS — Z1231 Encounter for screening mammogram for malignant neoplasm of breast: Secondary | ICD-10-CM

## 2016-04-20 ENCOUNTER — Ambulatory Visit
Admission: RE | Admit: 2016-04-20 | Discharge: 2016-04-20 | Disposition: A | Discharge status: Home / Self Care | Payer: Managed Care, Other (non HMO) | Source: Ambulatory Visit | Attending: Gynecology | Admitting: Gynecology

## 2016-04-20 DIAGNOSIS — Z1231 Encounter for screening mammogram for malignant neoplasm of breast: Secondary | ICD-10-CM

## 2016-07-04 ENCOUNTER — Encounter: Payer: Self-pay | Admitting: Women's Health

## 2016-07-04 ENCOUNTER — Ambulatory Visit (INDEPENDENT_AMBULATORY_CARE_PROVIDER_SITE_OTHER): Payer: Managed Care, Other (non HMO) | Admitting: Women's Health

## 2016-07-04 VITALS — BP 115/74

## 2016-07-04 DIAGNOSIS — B373 Candidiasis of vulva and vagina: Secondary | ICD-10-CM

## 2016-07-04 DIAGNOSIS — B3731 Acute candidiasis of vulva and vagina: Secondary | ICD-10-CM

## 2016-07-04 LAB — WET PREP FOR TRICH, YEAST, CLUE
CLUE CELLS WET PREP: NONE SEEN
Trich, Wet Prep: NONE SEEN
YEAST WET PREP: NONE SEEN

## 2016-07-04 LAB — PREGNANCY, URINE: Preg Test, Ur: NEGATIVE

## 2016-07-04 MED ORDER — FLUCONAZOLE 150 MG PO TABS: 150.0000 mg | ORAL_TABLET | Freq: Once | ORAL | 1 refills | Status: AC

## 2016-07-04 NOTE — Patient Instructions (Addendum)
Levonorgestrel intrauterine device (IUD) What is this medicine? LEVONORGESTREL IUD (LEE voe nor jes trel) is a contraceptive (birth control) device. The device is placed inside the uterus by a healthcare professional. It is used to prevent pregnancy and can also be used to treat heavy bleeding that occurs during your period. Depending on the device, it can be used for 3 to 5 years. This medicine may be used for other purposes; ask your health care provider or pharmacist if you have questions. What should I tell my health care provider before I take this medicine? They need to know if you have any of these conditions: -abnormal Pap smear -cancer of the breast, uterus, or cervix -diabetes -endometritis -genital or pelvic infection now or in the past -have more than one sexual partner or your partner has more than one partner -heart disease -history of an ectopic or tubal pregnancy -immune system problems -IUD in place -liver disease or tumor -problems with blood clots or take blood-thinners -use intravenous drugs -uterus of unusual shape -vaginal bleeding that has not been explained -an unusual or allergic reaction to levonorgestrel, other hormones, silicone, or polyethylene, medicines, foods, dyes, or preservatives -pregnant or trying to get pregnant -breast-feeding How should I use this medicine? This device is placed inside the uterus by a health care professional. Talk to your pediatrician regarding the use of this medicine in children. Special care may be needed. Overdosage: If you think you have taken too much of this medicine contact a poison control center or emergency room at once. NOTE: This medicine is only for you. Do not share this medicine with others. What if I miss a dose? This does not apply. What may interact with this medicine? Do not take this medicine with any of the following medications: -amprenavir -bosentan -fosamprenavir This medicine may also interact with  the following medications: -aprepitant -barbiturate medicines for inducing sleep or treating seizures -bexarotene -griseofulvin -medicines to treat seizures like carbamazepine, ethotoin, felbamate, oxcarbazepine, phenytoin, topiramate -modafinil -pioglitazone -rifabutin -rifampin -rifapentine -some medicines to treat HIV infection like atazanavir, indinavir, lopinavir, nelfinavir, tipranavir, ritonavir -St. John's wort -warfarin This list may not describe all possible interactions. Give your health care provider a list of all the medicines, herbs, non-prescription drugs, or dietary supplements you use. Also tell them if you smoke, drink alcohol, or use illegal drugs. Some items may interact with your medicine. What should I watch for while using this medicine? Visit your doctor or health care professional for regular check ups. See your doctor if you or your partner has sexual contact with others, becomes HIV positive, or gets a sexual transmitted disease. This product does not protect you against HIV infection (AIDS) or other sexually transmitted diseases. You can check the placement of the IUD yourself by reaching up to the top of your vagina with clean fingers to feel the threads. Do not pull on the threads. It is a good habit to check placement after each menstrual period. Call your doctor right away if you feel more of the IUD than just the threads or if you cannot feel the threads at all. The IUD may come out by itself. You may become pregnant if the device comes out. If you notice that the IUD has come out use a backup birth control method like condoms and call your health care provider. Using tampons will not change the position of the IUD and are okay to use during your period. What side effects may I notice from receiving this medicine?   Side effects that you should report to your doctor or health care professional as soon as possible: -allergic reactions like skin rash, itching or  hives, swelling of the face, lips, or tongue -fever, flu-like symptoms -genital sores -high blood pressure -no menstrual period for 6 weeks during use -pain, swelling, warmth in the leg -pelvic pain or tenderness -severe or sudden headache -signs of pregnancy -stomach cramping -sudden shortness of breath -trouble with balance, talking, or walking -unusual vaginal bleeding, discharge -yellowing of the eyes or skin Side effects that usually do not require medical attention (report to your doctor or health care professional if they continue or are bothersome): -acne -breast pain -change in sex drive or performance -changes in weight -cramping, dizziness, or faintness while the device is being inserted -headache -irregular menstrual bleeding within first 3 to 6 months of use -nausea This list may not describe all possible side effects. Call your doctor for medical advice about side effects. You may report side effects to FDA at 1-800-FDA-1088. Where should I keep my medicine? This does not apply. NOTE: This sheet is a summary. It may not cover all possible information. If you have questions about this medicine, talk to your doctor, pharmacist, or health care provider.    2016, Elsevier/Gold Standard. (2011-11-02 13:54:04)  Monilial Vaginitis Vaginitis in a soreness, swelling and redness (inflammation) of the vagina and vulva. Monilial vaginitis is not a sexually transmitted infection. CAUSES  Yeast vaginitis is caused by yeast (candida) that is normally found in your vagina. With a yeast infection, the candida has overgrown in number to a point that upsets the chemical balance. SYMPTOMS   White, thick vaginal discharge.  Swelling, itching, redness and irritation of the vagina and possibly the lips of the vagina (vulva).  Burning or painful urination.  Painful intercourse. DIAGNOSIS  Things that may contribute to monilial vaginitis are:  Postmenopausal and virginal  states.  Pregnancy.  Infections.  Being tired, sick or stressed, especially if you had monilial vaginitis in the past.  Diabetes. Good control will help lower the chance.  Birth control pills.  Tight fitting garments.  Using bubble bath, feminine sprays, douches or deodorant tampons.  Taking certain medications that kill germs (antibiotics).  Sporadic recurrence can occur if you become ill. TREATMENT  Your caregiver will give you medication.  There are several kinds of anti monilial vaginal creams and suppositories specific for monilial vaginitis. For recurrent yeast infections, use a suppository or cream in the vagina 2 times a week, or as directed.  Anti-monilial or steroid cream for the itching or irritation of the vulva may also be used. Get your caregiver's permission.  Painting the vagina with methylene blue solution may help if the monilial cream does not work.  Eating yogurt may help prevent monilial vaginitis. HOME CARE INSTRUCTIONS   Finish all medication as prescribed.  Do not have sex until treatment is completed or after your caregiver tells you it is okay.  Take warm sitz baths.  Do not douche.  Do not use tampons, especially scented ones.  Wear cotton underwear.  Avoid tight pants and panty hose.  Tell your sexual partner that you have a yeast infection. They should go to their caregiver if they have symptoms such as mild rash or itching.  Your sexual partner should be treated as well if your infection is difficult to eliminate.  Practice safer sex. Use condoms.  Some vaginal medications cause latex condoms to fail. Vaginal medications that harm condoms are:  Cleocin  cream.  Butoconazole (Femstat).  Terconazole (Terazol) vaginal suppository.  Miconazole (Monistat) (may be purchased over the counter). SEEK MEDICAL CARE IF:   You have a temperature by mouth above 102 F (38.9 C).  The infection is getting worse after 2 days of  treatment.  The infection is not getting better after 3 days of treatment.  You develop blisters in or around your vagina.  You develop vaginal bleeding, and it is not your menstrual period.  You have pain when you urinate.  You develop intestinal problems.  You have pain with sexual intercourse.   This information is not intended to replace advice given to you by your health care provider. Make sure you discuss any questions you have with your health care provider.   Document Released: 07/12/2005 Document Revised: 12/25/2011 Document Reviewed: 04/05/2015 Elsevier Interactive Patient Education [employer]2016 Elsevier Inc.

## 2016-07-04 NOTE — Progress Notes (Signed)
Presents with complaints of vaginal irritation, itching, yellow-green vaginal discharge, increased bloating and some pelvic tenderness. Symptoms began 9/16. Denies urinary complaints or changes in bowel habits. Married, uses condoms for contraception. Requests pregnancy test.   Exam: Appears well. External genitalia within normal limits. Speculum exam vaginal walls and cervix  mildly erythematous, scant white discharge. Wet prep negative. Urine pregnancy negative.  Clinical yeast vaginitis  Plan: Diflucan 150 mg PO once, 1 refill to be used PRN. Discussed contraception options, IUD information provided. Instructed to contact office or return if symptoms worsen or do not improve with medication.

## 2016-09-13 ENCOUNTER — Encounter: Payer: Self-pay | Admitting: Gynecology

## 2016-09-13 ENCOUNTER — Ambulatory Visit (INDEPENDENT_AMBULATORY_CARE_PROVIDER_SITE_OTHER): Payer: Managed Care, Other (non HMO) | Admitting: Gynecology

## 2016-09-13 VITALS — BP 112/74 | Ht 63.5 in | Wt 141.0 lb

## 2016-09-13 DIAGNOSIS — N898 Other specified noninflammatory disorders of vagina: Secondary | ICD-10-CM

## 2016-09-13 DIAGNOSIS — Z13220 Encounter for screening for lipoid disorders: Secondary | ICD-10-CM | POA: Diagnosis not present

## 2016-09-13 DIAGNOSIS — Z01419 Encounter for gynecological examination (general) (routine) without abnormal findings: Secondary | ICD-10-CM | POA: Diagnosis not present

## 2016-09-13 LAB — CBC WITH DIFFERENTIAL/PLATELET
BASOS PCT: 0 %
Basophils Absolute: 0 cells/uL (ref 0–200)
EOS ABS: 51 {cells}/uL (ref 15–500)
Eosinophils Relative: 1 %
HEMATOCRIT: 39.9 % (ref 35.0–45.0)
Hemoglobin: 13.3 g/dL (ref 11.7–15.5)
Lymphocytes Relative: 31 %
Lymphs Abs: 1581 cells/uL (ref 850–3900)
MCH: 29.6 pg (ref 27.0–33.0)
MCHC: 33.3 g/dL (ref 32.0–36.0)
MCV: 88.7 fL (ref 80.0–100.0)
MONO ABS: 306 {cells}/uL (ref 200–950)
MPV: 10.8 fL (ref 7.5–12.5)
Monocytes Relative: 6 %
NEUTROS ABS: 3162 {cells}/uL (ref 1500–7800)
Neutrophils Relative %: 62 %
PLATELETS: 174 10*3/uL (ref 140–400)
RBC: 4.5 MIL/uL (ref 3.80–5.10)
RDW: 12.3 % (ref 11.0–15.0)
WBC: 5.1 10*3/uL (ref 3.8–10.8)

## 2016-09-13 LAB — COMPREHENSIVE METABOLIC PANEL
ALK PHOS: 35 U/L (ref 33–115)
ALT: 13 U/L (ref 6–29)
AST: 20 U/L (ref 10–35)
Albumin: 4.2 g/dL (ref 3.6–5.1)
BILIRUBIN TOTAL: 0.6 mg/dL (ref 0.2–1.2)
BUN: 14 mg/dL (ref 7–25)
CALCIUM: 9.3 mg/dL (ref 8.6–10.2)
CO2: 28 mmol/L (ref 20–31)
Chloride: 102 mmol/L (ref 98–110)
Creat: 0.72 mg/dL (ref 0.50–1.10)
GLUCOSE: 82 mg/dL (ref 65–99)
POTASSIUM: 4.2 mmol/L (ref 3.5–5.3)
Sodium: 136 mmol/L (ref 135–146)
TOTAL PROTEIN: 6.9 g/dL (ref 6.1–8.1)

## 2016-09-13 LAB — LIPID PANEL
CHOL/HDL RATIO: 3.6 ratio (ref <5.0)
CHOLESTEROL: 221 mg/dL — AB (ref <200)
HDL: 61 mg/dL (ref >50)
LDL Cholesterol: 136 mg/dL — ABNORMAL HIGH (ref <100)
Triglycerides: 122 mg/dL (ref <150)
VLDL: 24 mg/dL (ref <30)

## 2016-09-13 MED ORDER — TERCONAZOLE 0.4 % VA CREA: 1.0000 | TOPICAL_CREAM | Freq: Every day | VAGINAL | 0 refills | Status: DC

## 2016-09-13 NOTE — Progress Notes (Signed)
    Jasmine [name] 30-Nov-1970 [id number]        45 y.o.  [id number]  for annual exam.  Also complaining of some vaginal irritation. Was treated by Jasmine [name] for yeast with Diflucan. No significant discharge or itching but notes with intercourse a vague discomfort.  Past medical history,surgical history, problem list, medications, allergies, family history and social history were all reviewed and documented as reviewed in the EPIC chart.  ROS:  Performed with pertinent positives and negatives included in the history, assessment and plan.   Additional significant findings :  None   Exam: Jasmine [name] assistant Vitals:   09/13/16 1205  BP: 112/74  Weight: 141 lb (64 kg)  Height: 5' 3.5" (1.613 m)   Body mass index is 24.59 kg/m.  General appearance:  Normal affect, orientation and appearance. Skin: Grossly normal HEENT: Without gross lesions.  No cervical or supraclavicular adenopathy. Thyroid normal.  Lungs:  Clear without wheezing, rales or rhonchi Cardiac: RR, without RMG Abdominal:  Soft, nontender, without masses, guarding, rebound, organomegaly or hernia Breasts:  Examined lying and sitting without masses, retractions, discharge or axillary adenopathy. Pelvic:  Ext, BUS, Vagina Normal without significant discharge  Cervix normal  Uterus anteverted, normal size, shape and contour, midline and mobile nontender   Adnexa without masses or tenderness    Anus and perineum normal   Rectovaginal normal sphincter tone without palpated masses or tenderness.    Assessment/Plan:  45 y.o. [id number] female for annual exam with regular menses, condom contraception.   1. Vaginal irritation with intercourse most recently. Exam without significant findings. Will cover for low-grade yeast with Terazol 7 day cream. Follow up if symptoms persist, worsen or recur. 2. Contraception. I again discussed contraception and the risks of pregnancy using condoms or withdrawal. Patient acknowledges but does not  want anything more effective. 3. Pap smear/HPV 2015. No Pap smear done today.  No history of significant abnormal Pap smears previously. Plan repeat Pap smear at 5 year interval per current screening guidelines. 4. Mammography 04/2016. Continue with annual mammography when due. SBE monthly reviewed. 5. Health maintenance. Baseline CBC, CMP, lipid profile, urinalysis ordered. Follow up in one year, sooner if any issues.   Dara LordsFONTAINE,TIMOTHY P MD, 12:29 PM 09/13/2016

## 2016-09-13 NOTE — Patient Instructions (Signed)

## 2016-09-14 ENCOUNTER — Other Ambulatory Visit: Payer: Self-pay | Admitting: Gynecology

## 2016-09-14 DIAGNOSIS — E7800 Pure hypercholesterolemia, unspecified: Secondary | ICD-10-CM

## 2016-09-14 LAB — URINALYSIS W MICROSCOPIC + REFLEX CULTURE
BILIRUBIN URINE: NEGATIVE
Bacteria, UA: NONE SEEN [HPF]
CRYSTALS: NONE SEEN [HPF]
Casts: NONE SEEN [LPF]
GLUCOSE, UA: NEGATIVE
Hgb urine dipstick: NEGATIVE
KETONES UR: NEGATIVE
LEUKOCYTES UA: NEGATIVE
Nitrite: NEGATIVE
PH: 6.5 (ref 5.0–8.0)
Protein, ur: NEGATIVE
RBC / HPF: NONE SEEN RBC/HPF (ref <=2)
SPECIFIC GRAVITY, URINE: 1.007 (ref 1.001–1.035)
Squamous Epithelial / LPF: NONE SEEN [HPF] (ref <=5)
WBC UA: NONE SEEN WBC/HPF (ref <=5)
Yeast: NONE SEEN [HPF]

## 2017-05-01 ENCOUNTER — Other Ambulatory Visit: Payer: Self-pay | Admitting: Gynecology

## 2017-05-01 DIAGNOSIS — Z1231 Encounter for screening mammogram for malignant neoplasm of breast: Secondary | ICD-10-CM

## 2017-05-16 ENCOUNTER — Ambulatory Visit
Admission: RE | Admit: 2017-05-16 | Discharge: 2017-05-16 | Disposition: A | Discharge status: Home / Self Care | Payer: 59 | Source: Ambulatory Visit | Attending: Gynecology | Admitting: Gynecology

## 2017-05-16 ENCOUNTER — Ambulatory Visit: Payer: Managed Care, Other (non HMO)

## 2017-05-16 DIAGNOSIS — Z1231 Encounter for screening mammogram for malignant neoplasm of breast: Secondary | ICD-10-CM

## 2017-09-19 ENCOUNTER — Encounter: Payer: Managed Care, Other (non HMO) | Admitting: Gynecology

## 2017-10-03 ENCOUNTER — Ambulatory Visit (INDEPENDENT_AMBULATORY_CARE_PROVIDER_SITE_OTHER): Payer: Commercial Managed Care - PPO | Admitting: Gynecology

## 2017-10-03 ENCOUNTER — Encounter: Payer: Self-pay | Admitting: Gynecology

## 2017-10-03 VITALS — BP 116/74 | Ht 64.0 in | Wt 145.0 lb

## 2017-10-03 DIAGNOSIS — Z01419 Encounter for gynecological examination (general) (routine) without abnormal findings: Secondary | ICD-10-CM | POA: Diagnosis not present

## 2017-10-03 DIAGNOSIS — Z13220 Encounter for screening for lipoid disorders: Secondary | ICD-10-CM | POA: Diagnosis not present

## 2017-10-03 NOTE — Patient Instructions (Signed)
Follow-up for fasting blood work as arranged.  Follow-up in 1 year for annual exam 

## 2017-10-03 NOTE — Progress Notes (Signed)
    Jasmine [name] 1971-09-25 [id number]        46 y.o.  [id number] for annual gynecologic exam.    Past medical history,surgical history, problem list, medications, allergies, family history and social history were all reviewed and documented as reviewed in the EPIC chart.  ROS:  Performed with pertinent positives and negatives included in the history, assessment and plan.   Additional significant findings : None   Exam: Kennon PortelaKim Deford assistant Vitals:   10/03/17 1459  BP: 116/74  Weight: 145 lb (65.8 kg)  Height: 5\' 4"  (1.626 m)   Body mass index is 24.89 kg/m.  General appearance:  Normal affect, orientation and appearance. Skin: Grossly normal HEENT: Without gross lesions.  No cervical or supraclavicular adenopathy. Thyroid normal.  Lungs:  Clear without wheezing, rales or rhonchi Cardiac: RR, without RMG Abdominal:  Soft, nontender, without masses, guarding, rebound, organomegaly or hernia Breasts:  Examined lying and sitting without masses, retractions, discharge or axillary adenopathy. Pelvic:  Ext, BUS, Vagina: Normal  Cervix: Normal  Uterus: Anteverted, normal size, shape and contour, midline and mobile nontender   Adnexa: Without masses or tenderness    Anus and perineum: Normal   Rectovaginal: Normal sphincter tone without palpated masses or tenderness.    Assessment/Plan:  46 y.o. [id number] female for annual gynecologic exam with regular menses, condom contraception.   1. Contraception.  Continues with condoms which are acceptable to her.  Understands failure risks. 2. Pap smear/HPV 2015.  No Pap smear done today.  No history of significant abnormal Pap smears.  Plan repeat Pap smear at 5-year interval per current screening guidelines. 3. Mammography 05/2017.  Continue with annual mammography when due.  Breast exam normal today.  SBE monthly reviewed. 4. Health maintenance.  Future orders placed for fasting CBC, CMP, lipid profile, urine analysis.  Will return when  scheduled.  Otherwise follow-up in 1 year, sooner as needed.   Dara Lordsimothy P Birney Belshe MD, 3:22 PM 10/03/2017

## 2017-10-05 LAB — URINALYSIS W MICROSCOPIC + REFLEX CULTURE
BACTERIA UA: NONE SEEN /HPF
Bilirubin Urine: NEGATIVE
Glucose, UA: NEGATIVE
HGB URINE DIPSTICK: NEGATIVE
Ketones, ur: NEGATIVE
Leukocyte Esterase: NEGATIVE
Nitrites, Initial: NEGATIVE
PH: 6 (ref 5.0–8.0)
Protein, ur: NEGATIVE
RBC / HPF: NONE SEEN /HPF (ref 0–2)
Specific Gravity, Urine: 1.025 (ref 1.001–1.03)
Squamous Epithelial / LPF: NONE SEEN /HPF (ref <=5)
WBC UA: NONE SEEN /HPF (ref 0–5)

## 2017-10-05 LAB — NO CULTURE INDICATED

## 2018-02-05 DIAGNOSIS — K219 Gastro-esophageal reflux disease without esophagitis: Secondary | ICD-10-CM | POA: Insufficient documentation

## 2018-02-05 DIAGNOSIS — R040 Epistaxis: Secondary | ICD-10-CM | POA: Insufficient documentation

## 2018-10-04 ENCOUNTER — Encounter: Payer: Self-pay | Admitting: Gynecology

## 2018-10-04 ENCOUNTER — Ambulatory Visit (INDEPENDENT_AMBULATORY_CARE_PROVIDER_SITE_OTHER): Payer: Commercial Managed Care - PPO | Admitting: Gynecology

## 2018-10-04 VITALS — BP 112/76 | Ht 63.5 in | Wt 144.0 lb

## 2018-10-04 DIAGNOSIS — Z01419 Encounter for gynecological examination (general) (routine) without abnormal findings: Secondary | ICD-10-CM

## 2018-10-04 DIAGNOSIS — Z1151 Encounter for screening for human papillomavirus (HPV): Secondary | ICD-10-CM | POA: Diagnosis not present

## 2018-10-04 NOTE — Progress Notes (Signed)
    Jasmine LingoJuliet R [name] 04/20/1971 [id number]        47 y.o.  [id number] for annual gynecologic exam.  Without gynecologic complaints  Past medical history,surgical history, problem list, medications, allergies, family history and social history were all reviewed and documented as reviewed in the EPIC chart.  ROS:  Performed with pertinent positives and negatives included in the history, assessment and plan.   Additional significant findings : None   Exam: Kennon PortelaKim Humes assistant Vitals:   10/04/18 1552  BP: 112/76  Weight: 144 lb (65.3 kg)  Height: 5' 3.5" (1.613 m)   Body mass index is 25.11 kg/m.  General appearance:  Normal affect, orientation and appearance. Skin: Grossly normal HEENT: Without gross lesions.  No cervical or supraclavicular adenopathy. Thyroid normal.  Lungs:  Clear without wheezing, rales or rhonchi Cardiac: RR, without RMG Abdominal:  Soft, nontender, without masses, guarding, rebound, organomegaly or hernia Breasts:  Examined lying and sitting without masses, retractions, discharge or axillary adenopathy. Pelvic:  Ext, BUS, Vagina: With light menses flow  Cervix: Normal.  Pap smear done  Uterus: Anteverted, normal size, shape and contour, midline and mobile nontender   Adnexa: Without masses or tenderness    Anus and perineum: Normal   Rectovaginal: Normal sphincter tone without palpated masses or tenderness.    Assessment/Plan:  47 y.o. [id number] female for annual gynecologic exam with regular menses, condom contraception.   1. Condom contraception.  Acceptable to the patient.  Understands failure risks. 2. Pap smear/HPV 08/2014.  Pap smear/HPV today.  No history of abnormal Pap smears. 3. Mammography due and I reminded patient to call and schedule.  Breast exam normal today. 4. Health maintenance.  Patient reports having lab work done through [employer] [facility].  Follow-up 1 year, sooner as needed.   Dara Lordsimothy P Mitchell Epling MD, 4:13 PM  10/04/2018

## 2018-10-04 NOTE — Addendum Note (Signed)
Addended by: Dayna BarkerGARDNER, Keyon Winnick K on: 10/04/2018 04:20 PM   Modules accepted: Orders

## 2018-10-04 NOTE — Patient Instructions (Signed)
Follow-up in 1 year, sooner as needed. 

## 2018-10-07 LAB — PAP IG AND HPV HIGH-RISK: HPV DNA High Risk: NOT DETECTED

## 2018-10-14 ENCOUNTER — Other Ambulatory Visit: Payer: Self-pay | Admitting: Gynecology

## 2018-10-14 DIAGNOSIS — Z1231 Encounter for screening mammogram for malignant neoplasm of breast: Secondary | ICD-10-CM

## 2018-11-19 ENCOUNTER — Ambulatory Visit
Admission: RE | Admit: 2018-11-19 | Discharge: 2018-11-19 | Disposition: A | Discharge status: Home / Self Care | Payer: Commercial Managed Care - PPO | Source: Ambulatory Visit | Attending: Gynecology | Admitting: Gynecology

## 2018-11-19 DIAGNOSIS — Z1231 Encounter for screening mammogram for malignant neoplasm of breast: Secondary | ICD-10-CM

## 2019-05-15 IMAGING — MG DIGITAL SCREENING BILATERAL MAMMOGRAM WITH TOMO AND CAD
8 series · 8 of 24 positions shown · non-contrast
Comparison: Previous exam(s).

CLINICAL DATA: Screening.

EXAM:
DIGITAL SCREENING BILATERAL MAMMOGRAM WITH TOMO AND CAD

[R CC synth-2D]
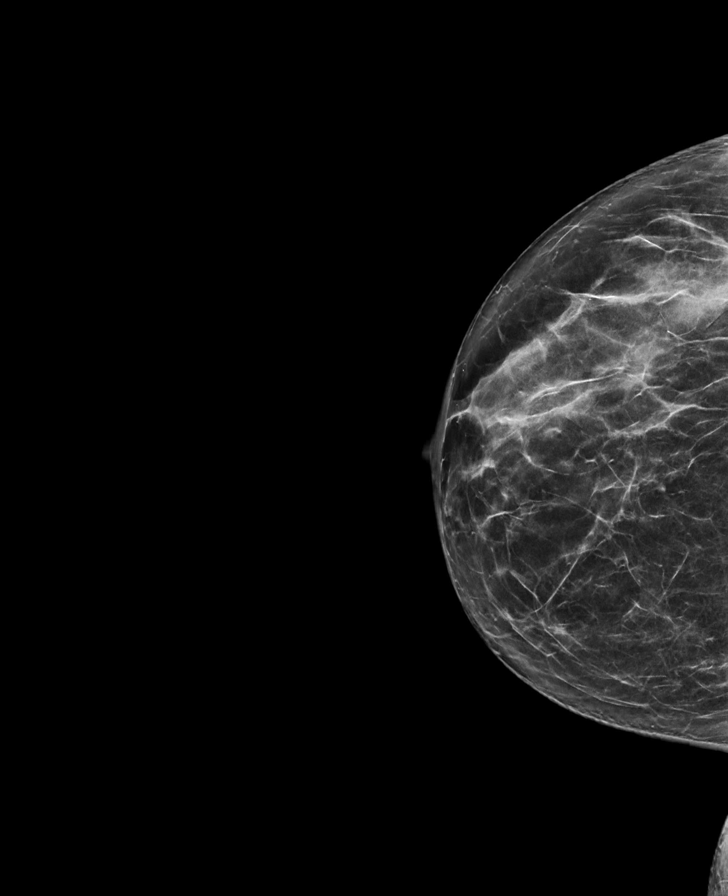

[L CC synth-2D]
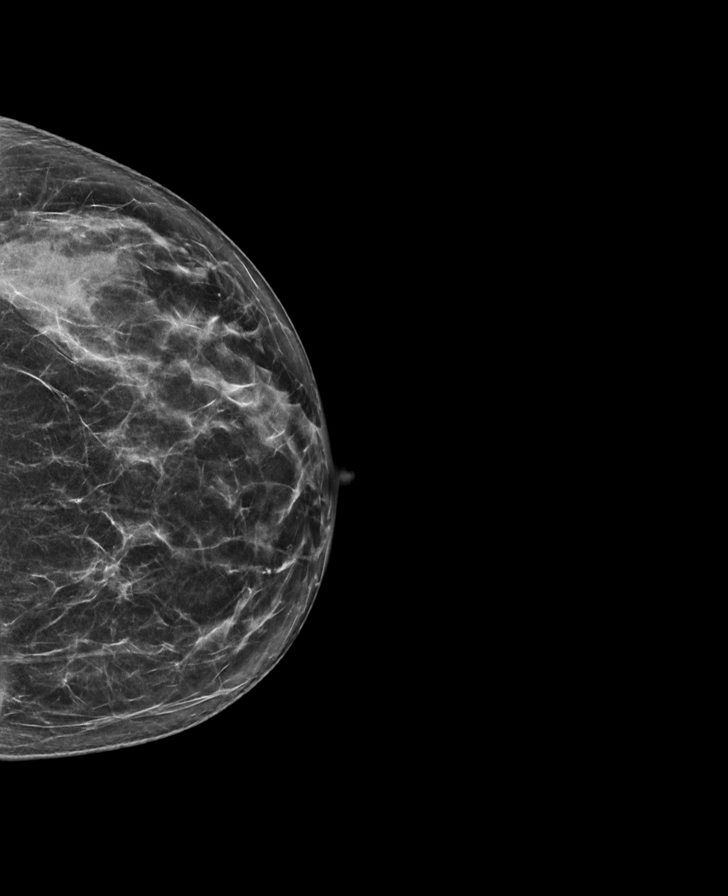

[R MLO synth-2D]
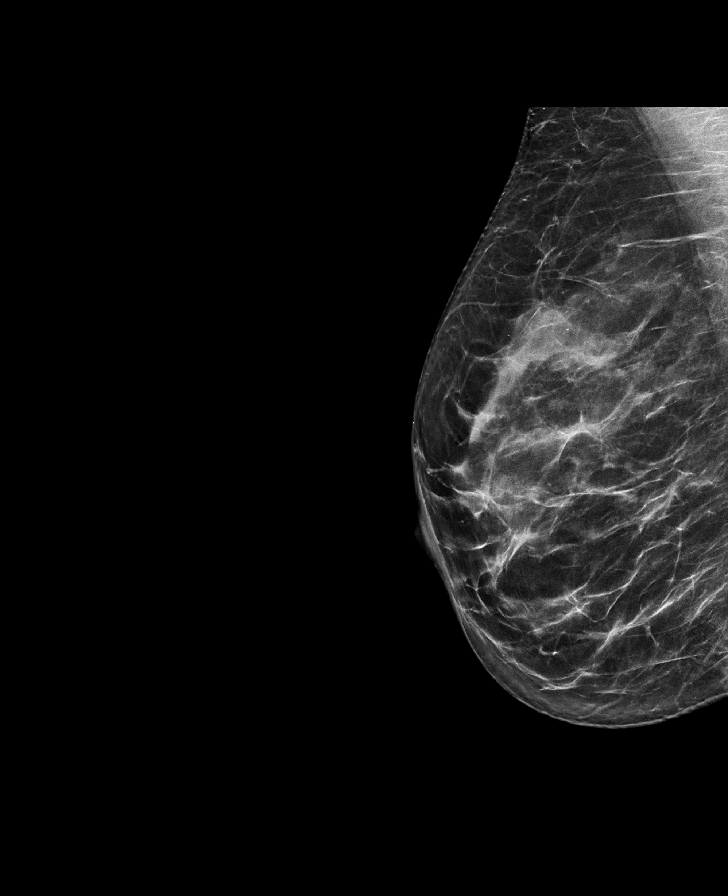

[L MLO synth-2D]
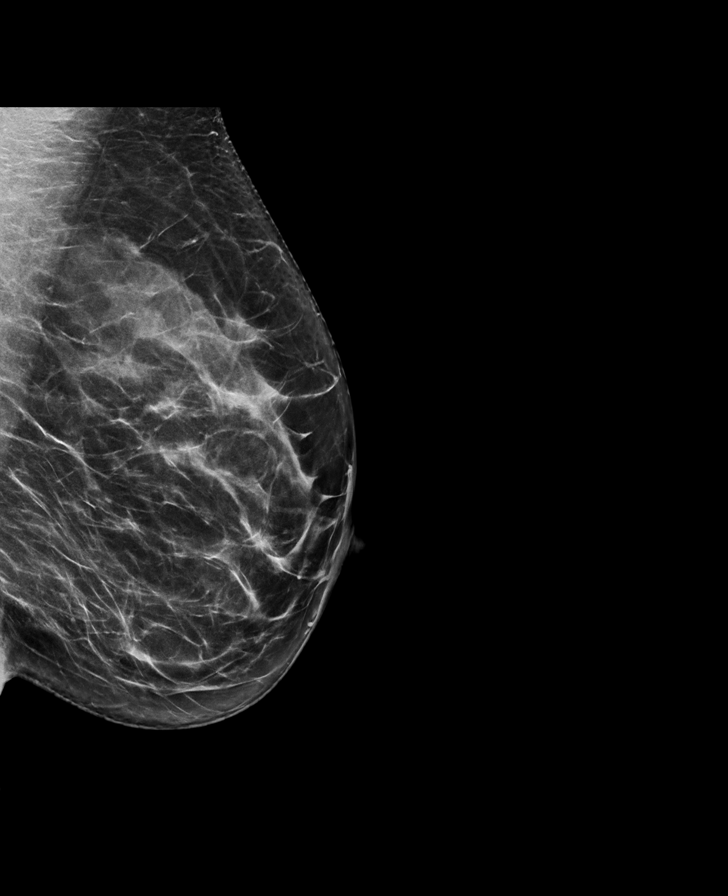

[L MLO tomo · tomo slice 40/79.0]
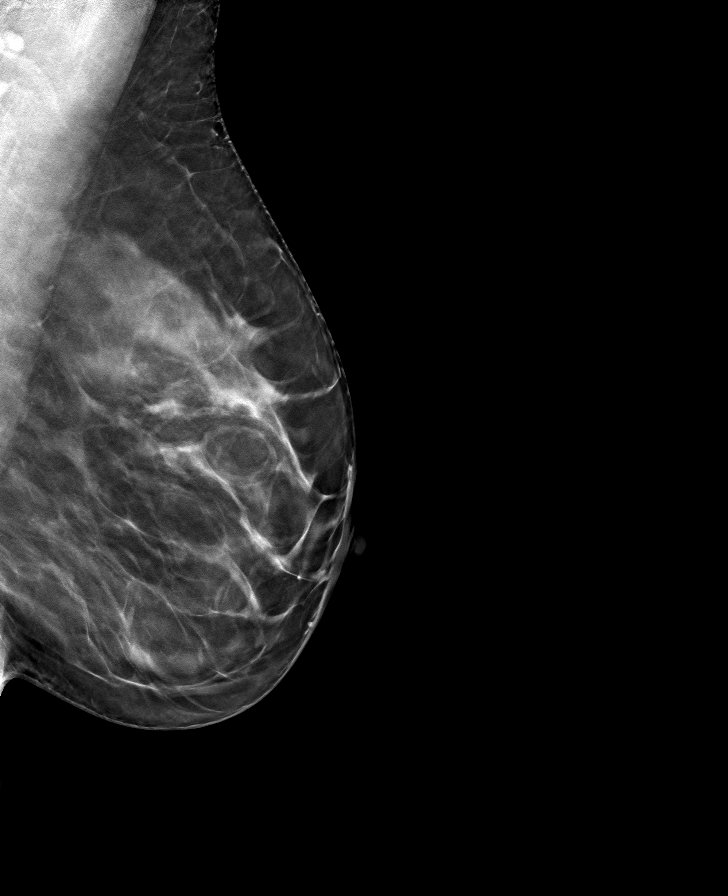

[R CC tomo · tomo slice 36/71.0]
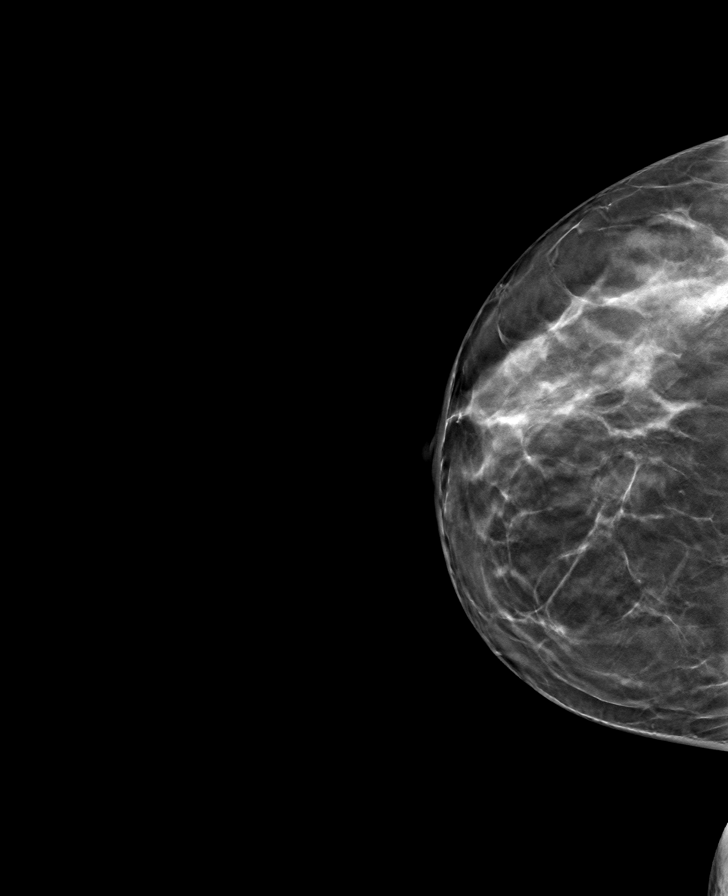

[L CC tomo · tomo slice 36/71.0]
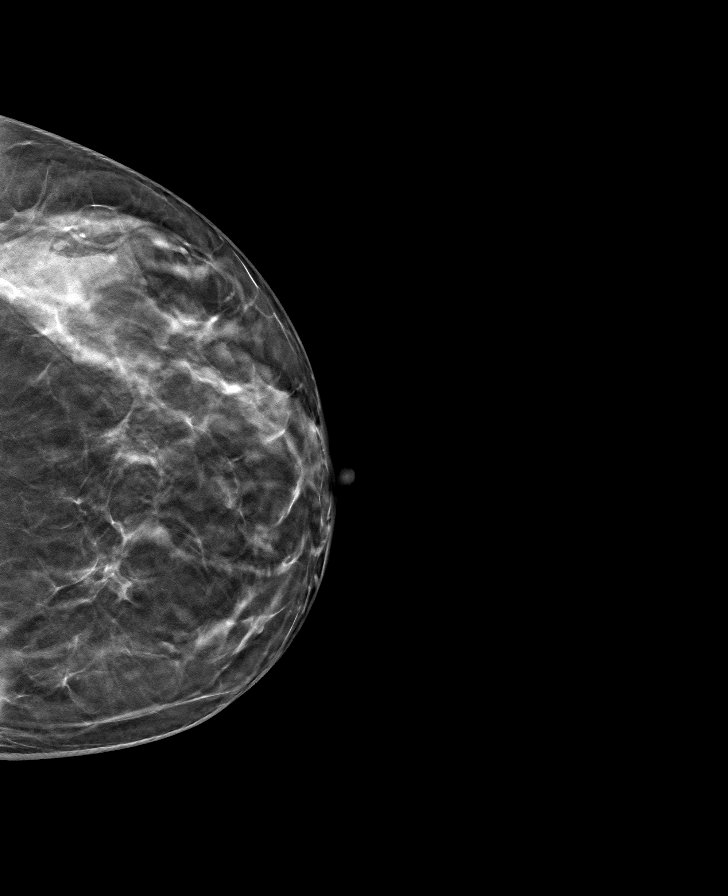

[R MLO tomo · tomo slice 37/73.0]
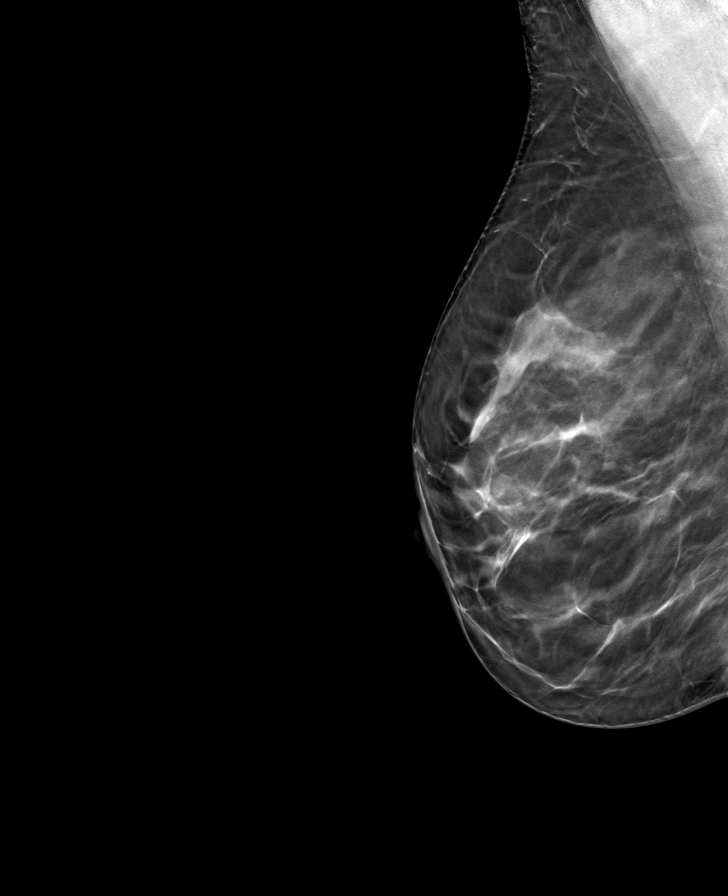

[8 of 24 positions shown; findings below may reference images not displayed]

ACR Breast Density Category c: The breast tissue is heterogeneously
dense, which may obscure small masses.
FINDINGS: There are no findings suspicious for malignancy. Images were
processed with CAD.
IMPRESSION: No mammographic evidence of malignancy. A result letter of this
screening mammogram will be mailed directly to the patient.

RECOMMENDATION:
Screening mammogram in one year. (Code:[id number]-[id number])

BI-RADS CATEGORY  1: Negative.

## 2019-07-08 ENCOUNTER — Encounter: Payer: Self-pay | Admitting: Gynecology

## 2019-10-03 ENCOUNTER — Other Ambulatory Visit: Payer: Self-pay

## 2019-10-06 ENCOUNTER — Ambulatory Visit (INDEPENDENT_AMBULATORY_CARE_PROVIDER_SITE_OTHER): Payer: Commercial Managed Care - PPO | Admitting: Gynecology

## 2019-10-06 ENCOUNTER — Other Ambulatory Visit: Payer: Self-pay

## 2019-10-06 ENCOUNTER — Encounter: Payer: Self-pay | Admitting: Gynecology

## 2019-10-06 VITALS — BP 118/76 | Ht 63.0 in | Wt 146.0 lb

## 2019-10-06 DIAGNOSIS — Z01419 Encounter for gynecological examination (general) (routine) without abnormal findings: Secondary | ICD-10-CM

## 2019-10-06 DIAGNOSIS — Z13220 Encounter for screening for lipoid disorders: Secondary | ICD-10-CM

## 2019-10-06 NOTE — Progress Notes (Signed)
    Jasmine [name] [birth year] [id number]        48 y.o.  [id number] for annual gynecologic exam.  Notes some mild menstrual irregularity and some change in flow.  Past medical history,surgical history, problem list, medications, allergies, family history and social history were all reviewed and documented as reviewed in the EPIC chart.  ROS:  Performed with pertinent positives and negatives included in the history, assessment and plan.   Additional significant findings : None   Exam: Caryn Bee assistant Vitals:   10/06/19 1505  BP: 118/76  Weight: 146 lb (66.2 kg)  Height: 5\' 3"  (1.6 m)   Body mass index is 25.86 kg/m.  General appearance:  Normal affect, orientation and appearance. Skin: Grossly normal HEENT: Without gross lesions.  No cervical or supraclavicular adenopathy. Thyroid normal.  Lungs:  Clear without wheezing, rales or rhonchi Cardiac: RR, without RMG Abdominal:  Soft, nontender, without masses, guarding, rebound, organomegaly or hernia Breasts:  Examined lying and sitting without masses, retractions, discharge or axillary adenopathy. Pelvic:  Ext, BUS, Vagina: Normal  Cervix: Normal  Uterus: Anteverted, normal size, shape and contour, midline and mobile nontender   Adnexa: Without masses or tenderness    Anus and perineum: Normal   Rectovaginal: Normal sphincter tone without palpated masses or tenderness.    Assessment/Plan:  48 y.o. [id number] female for annual gynecologic exam.  With monthly menses, condom contraception  1. Condom contraception acceptable to the patient. 2. Mild menstrual irregularity and slight change in flow.  No significant hot flushes or sweats.  We discussed what to expect in the perimenopause.  Follow-up if significant symptoms or significant irregularity in flow develop. 3. Mammography 11/2018.  Continue with annual mammography when due.  Breast exam normal today. 4. Pap smear/HPV 09/2018.  No Pap smear done today.  No history of  significant abnormal Pap smears.  Plan repeat Pap smear/HPV 5-year interval per current screening guidelines. 5. Health maintenance.  Future orders placed for CBC, CMP and lipid profile.  She knows to follow-up on these results after having drawn as I am retiring to make sure that they are normal.  Follow-up in 1 year for annual exam   Anastasio Auerbach MD, 3:27 PM 10/06/2019

## 2019-10-06 NOTE — Patient Instructions (Signed)
Follow-up for fasting blood work as arranged. 

## 2019-10-08 ENCOUNTER — Other Ambulatory Visit: Payer: Commercial Managed Care - PPO

## 2020-07-13 ENCOUNTER — Encounter: Payer: Self-pay | Admitting: Nurse Practitioner

## 2020-10-07 ENCOUNTER — Other Ambulatory Visit: Payer: Self-pay

## 2020-10-07 ENCOUNTER — Encounter: Payer: Self-pay | Admitting: Nurse Practitioner

## 2020-10-07 ENCOUNTER — Ambulatory Visit (INDEPENDENT_AMBULATORY_CARE_PROVIDER_SITE_OTHER): Payer: Commercial Managed Care - PPO | Admitting: Nurse Practitioner

## 2020-10-07 VITALS — BP 122/76 | Ht 63.0 in | Wt 141.0 lb

## 2020-10-07 DIAGNOSIS — Z01419 Encounter for gynecological examination (general) (routine) without abnormal findings: Secondary | ICD-10-CM

## 2020-10-07 DIAGNOSIS — N951 Menopausal and female climacteric states: Secondary | ICD-10-CM | POA: Diagnosis not present

## 2020-10-07 NOTE — Progress Notes (Signed)
   Jasmine [name] 1971/05/18 [id number]   History:  49 y.o. [id number] presents for annual exam. She is beginning to have irregular cycles with occasional hot flashes. They are tolerable at this point. Normal pap and mammogram history.   Gynecologic History Patient's last menstrual period was 08/20/2020.   Contraception: condoms Last Pap: 09/2018. Results were: normal Last mammogram: 07/13/2020. Results were: normal  Past medical history, past surgical history, family history and social history were all reviewed and documented in the EPIC chart. 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   ROS:  A ROS was performed and pertinent positives and negatives are included.  Exam:  Vitals:   10/07/20 1038  BP: 122/76  Weight: 141 lb (64 kg)  Height: 5\' 3"  (1.6 m)   Body mass index is 24.98 kg/m.  General appearance:  Normal Thyroid:  Symmetrical, normal in size, without palpable masses or nodularity. Respiratory  Auscultation:  Clear without wheezing or rhonchi Cardiovascular  Auscultation:  Regular rate, without rubs, murmurs or gallops  Edema/varicosities:  Not grossly evident Abdominal  Soft,nontender, without masses, guarding or rebound.  Liver/spleen:  No organomegaly noted  Hernia:  None appreciated  Skin  Inspection:  Grossly normal   Breasts: Examined lying and sitting.   Right: Without masses, retractions, discharge or axillary adenopathy.   Left: Without masses, retractions, discharge or axillary adenopathy. Gentitourinary   Inguinal/mons:  Normal without inguinal adenopathy  External genitalia:  Normal  BUS/Urethra/Skene's glands:  Normal  Vagina:  Normal  Cervix:  Normal  Uterus:  Normal in size, shape and contour.  Midline and mobile  Adnexa/parametria:     Rt: Without masses or tenderness.   Lt: Without masses or tenderness.  Anus and perineum: Normal  Digital rectal exam: Normal sphincter tone without palpated masses or tenderness  Assessment/Plan:  49  y.o. 54 for annual exam.   Well female exam with routine gynecological exam - Education provided on SBEs, importance of preventative screenings, current guidelines, high calcium diet, regular exercise, and multivitamin daily. Establishing with PCP in February and will have labs done then.   Perimenopause - beginning to have irregular cycles with occasional hot flashes. They are tolerable at this time. Discussed possibility of pregnancy until cessation of menses x 1 year.   Screening for cervical cancer - Normal Pap history.  Will repeat at 5-year interval per guidelines.  Screening for breast cancer - Normal mammogram history.  Continue annual screenings.  Normal breast exam today.  Screening for colon cancer - Will start screening colonoscopy at age 23.   Follow up in 1 year for annual.     [facility], 10:49 AM 10/07/2020

## 2020-10-07 NOTE — Patient Instructions (Signed)
Menopause °Menopause is the normal time of life when menstrual periods stop completely. It is usually confirmed by 12 months without a menstrual period. The transition to menopause (perimenopause) most often happens between the ages of 45 and 55. During perimenopause, hormone levels change in your body, which can cause symptoms and affect your health. Menopause may increase your risk for: °· Loss of bone (osteoporosis), which causes bone breaks (fractures). °· Depression. °· Hardening and narrowing of the arteries (atherosclerosis), which can cause heart attacks and strokes. °What are the causes? °This condition is usually caused by a natural change in hormone levels that happens as you get older. The condition may also be caused by surgery to remove both ovaries (bilateral oophorectomy). °What increases the risk? °This condition is more likely to start at an earlier age if you have certain medical conditions or treatments, including: °· A tumor of the pituitary gland in the brain. °· A disease that affects the ovaries and hormone production. °· Radiation treatment for cancer. °· Certain cancer treatments, such as chemotherapy or hormone (anti-estrogen) therapy. °· Heavy smoking and excessive alcohol use. °· Family history of early menopause. °This condition is also more likely to develop earlier in women who are very thin. °What are the signs or symptoms? °Symptoms of this condition include: °· Hot flashes. °· Irregular menstrual periods. °· Night sweats. °· Changes in feelings about sex. This could be a decrease in sex drive or an increased comfort around your sexuality. °· Vaginal dryness and thinning of the vaginal walls. This may cause painful intercourse. °· Dryness of the skin and development of wrinkles. °· Headaches. °· Problems sleeping (insomnia). °· Mood swings or irritability. °· Memory problems. °· Weight gain. °· Hair growth on the face and chest. °· Bladder infections or problems with urinating. °How  is this diagnosed? °This condition is diagnosed based on your medical history, a physical exam, your age, your menstrual history, and your symptoms. Hormone tests may also be done. °How is this treated? °In some cases, no treatment is needed. You and your health care provider should make a decision together about whether treatment is necessary. Treatment will be based on your individual condition and preferences. Treatment for this condition focuses on managing symptoms. Treatment may include: °· Menopausal hormone therapy (MHT). °· Medicines to treat specific symptoms or complications. °· Acupuncture. °· Vitamin or herbal supplements. °Before starting treatment, make sure to let your health care provider know if you have a personal or family history of: °· Heart disease. °· Breast cancer. °· Blood clots. °· Diabetes. °· Osteoporosis. °Follow these instructions at home: °Lifestyle °· Do not use any products that contain nicotine or tobacco, such as cigarettes and e-cigarettes. If you need help quitting, ask your health care provider. °· Get at least 30 minutes of physical activity on 5 or more days each week. °· Avoid alcoholic and caffeinated beverages, as well as spicy foods. This may help prevent hot flashes. °· Get 7-8 hours of sleep each night. °· If you have hot flashes, try: °? Dressing in layers. °? Avoiding things that may trigger hot flashes, such as spicy food, warm places, or stress. °? Taking slow, deep breaths when a hot flash starts. °? Keeping a fan in your home and office. °· Find ways to manage stress, such as deep breathing, meditation, or journaling. °· Consider going to group therapy with other women who are having menopause symptoms. Ask your health care provider about recommended group therapy meetings. °Eating and   drinking °· Eat a healthy, balanced diet that contains whole grains, lean protein, low-fat dairy, and plenty of fruits and vegetables. °· Your health care provider may recommend  adding more soy to your diet. Foods that contain soy include tofu, tempeh, and soy milk. °· Eat plenty of foods that contain calcium and vitamin D for bone health. Items that are rich in calcium include low-fat milk, yogurt, beans, almonds, sardines, broccoli, and kale. °Medicines °· Take over-the-counter and prescription medicines only as told by your health care provider. °· Talk with your health care provider before starting any herbal supplements. If prescribed, take vitamins and supplements as told by your health care provider. These may include: °? Calcium. Women age 51 and older should get 1,200 mg (milligrams) of calcium every day. °? Vitamin D. Women need 600-800 International Units of vitamin D each day. °? Vitamins B12 and B6. Aim for 50 micrograms of B12 and 1.5 mg of B6 each day. °General instructions °· Keep track of your menstrual periods, including: °? When they occur. °? How heavy they are and how long they last. °? How much time passes between periods. °· Keep track of your symptoms, noting when they start, how often you have them, and how long they last. °· Use vaginal lubricants or moisturizers to help with vaginal dryness and improve comfort during sex. °· Keep all follow-up visits as told by your health care provider. This is important. This includes any group therapy or counseling. °Contact a health care provider if: °· You are still having menstrual periods after age 55. °· You have pain during sex. °· You have not had a period for 12 months and you develop vaginal bleeding. °Get help right away if: °· You have: °? Severe depression. °? Excessive vaginal bleeding. °? Pain when you urinate. °? A fast or irregular heart beat (palpitations). °? Severe headaches. °? Abdomen (abdominal) pain or severe indigestion. °· You fell and you think you have a broken bone. °· You develop leg or chest pain. °· You develop vision problems. °· You feel a lump in your breast. °Summary °· Menopause is the normal  time of life when menstrual periods stop completely. It is usually confirmed by 12 months without a menstrual period. °· The transition to menopause (perimenopause) most often happens between the ages of 45 and 55. °· Symptoms can be managed through medicines, lifestyle changes, and complementary therapies such as acupuncture. °· Eat a balanced diet that is rich in nutrients to promote bone health and heart health and to manage symptoms during menopause. °This information is not intended to replace advice given to you by your health care provider. Make sure you discuss any questions you have with your health care provider. °Document Revised: 09/14/2017 Document Reviewed: 11/04/2016 °Elsevier Patient Education © 2020 [employer] °Health Maintenance, Female °Adopting a healthy lifestyle and getting preventive care are important in promoting health and wellness. Ask your health care provider about: °· The right schedule for you to have regular tests and exams. °· Things you can do on your own to prevent diseases and keep yourself healthy. °What should I know about diet, weight, and exercise? °Eat a healthy diet ° °· Eat a diet that includes plenty of vegetables, fruits, low-fat dairy products, and lean protein. °· Do not eat a lot of foods that are high in solid fats, added sugars, or sodium. °Maintain a healthy weight °Body mass index (BMI) is used to identify weight problems. It estimates body fat based   on height and weight. Your health care provider can help determine your BMI and help you achieve or maintain a healthy weight. °Get regular exercise °Get regular exercise. This is one of the most important things you can do for your health. Most adults should: °· Exercise for at least 150 minutes each week. The exercise should increase your heart rate and make you sweat (moderate-intensity exercise). °· Do strengthening exercises at least twice a week. This is in addition to the moderate-intensity exercise. °· Spend  less time sitting. Even light physical activity can be beneficial. °Watch cholesterol and blood lipids °Have your blood tested for lipids and cholesterol at 49 years of age, then have this test every 5 years. °Have your cholesterol levels checked more often if: °· Your lipid or cholesterol levels are high. °· You are older than 49 years of age. °· You are at high risk for heart disease. °What should I know about cancer screening? °Depending on your health history and family history, you may need to have cancer screening at various ages. This may include screening for: °· Breast cancer. °· Cervical cancer. °· Colorectal cancer. °· Skin cancer. °· Lung cancer. °What should I know about heart disease, diabetes, and high blood pressure? °Blood pressure and heart disease °· High blood pressure causes heart disease and increases the risk of stroke. This is more likely to develop in people who have high blood pressure readings, are of African descent, or are overweight. °· Have your blood pressure checked: °? Every 3-5 years if you are 18-39 years of age. °? Every year if you are 40 years old or older. °Diabetes °Have regular diabetes screenings. This checks your fasting blood sugar level. Have the screening done: °· Once every three years after age 40 if you are at a normal weight and have a low risk for diabetes. °· More often and at a younger age if you are overweight or have a high risk for diabetes. °What should I know about preventing infection? °Hepatitis B °If you have a higher risk for hepatitis B, you should be screened for this virus. Talk with your health care provider to find out if you are at risk for hepatitis B infection. °Hepatitis C °Testing is recommended for: °· Everyone born from 1945 through 1965. °· Anyone with known risk factors for hepatitis C. °Sexually transmitted infections (STIs) °· Get screened for STIs, including gonorrhea and chlamydia, if: °? You are sexually active and are younger than 49  years of age. °? You are older than 49 years of age and your health care provider tells you that you are at risk for this type of infection. °? Your sexual activity has changed since you were last screened, and you are at increased risk for chlamydia or gonorrhea. Ask your health care provider if you are at risk. °· Ask your health care provider about whether you are at high risk for HIV. Your health care provider may recommend a prescription medicine to help prevent HIV infection. If you choose to take medicine to prevent HIV, you should first get tested for HIV. You should then be tested every 3 months for as long as you are taking the medicine. °Pregnancy °· If you are about to stop having your period (premenopausal) and you may become pregnant, seek counseling before you get pregnant. °· Take 400 to 800 micrograms (mcg) of folic acid every day if you become pregnant. °· Ask for birth control (contraception) if you want to prevent pregnancy. °  Osteoporosis and menopause °Osteoporosis is a disease in which the bones lose minerals and strength with aging. This can result in bone fractures. If you are 65 years old or older, or if you are at risk for osteoporosis and fractures, ask your health care provider if you should: °· Be screened for bone loss. °· Take a calcium or vitamin D supplement to lower your risk of fractures. °· Be given hormone replacement therapy (HRT) to treat symptoms of menopause. °Follow these instructions at home: °Lifestyle °· Do not use any products that contain nicotine or tobacco, such as cigarettes, e-cigarettes, and chewing tobacco. If you need help quitting, ask your health care provider. °· Do not use street drugs. °· Do not share needles. °· Ask your health care provider for help if you need support or information about quitting drugs. °Alcohol use °· Do not drink alcohol if: °? Your health care provider tells you not to drink. °? You are pregnant, may be pregnant, or are planning to  become pregnant. °· If you drink alcohol: °? Limit how much you use to 0-1 drink a day. °? Limit intake if you are breastfeeding. °· Be aware of how much alcohol is in your drink. In the [place] one drink equals one 12 oz bottle of beer (355 mL), one 5 oz glass of wine (148 mL), or one 1½ oz glass of hard liquor (44 mL). °General instructions °· Schedule regular health, dental, and eye exams. °· Stay current with your vaccines. °· Tell your health care provider if: °? You often feel depressed. °? You have ever been abused or do not feel safe at home. °Summary °· Adopting a healthy lifestyle and getting preventive care are important in promoting health and wellness. °· Follow your health care provider's instructions about healthy diet, exercising, and getting tested or screened for diseases. °· Follow your health care provider's instructions on monitoring your cholesterol and blood pressure. °This information is not intended to replace advice given to you by your health care provider. Make sure you discuss any questions you have with your health care provider. °Document Revised: 09/25/2018 Document Reviewed: 09/25/2018 °Elsevier Patient Education © 2020 [employer]. ° °

## 2020-12-02 ENCOUNTER — Other Ambulatory Visit: Payer: Self-pay

## 2020-12-02 ENCOUNTER — Encounter: Payer: Self-pay | Admitting: Family Medicine

## 2020-12-02 ENCOUNTER — Ambulatory Visit (INDEPENDENT_AMBULATORY_CARE_PROVIDER_SITE_OTHER): Payer: Commercial Managed Care - PPO | Admitting: Family Medicine

## 2020-12-02 VITALS — BP 112/60 | HR 73 | Temp 97.7°F | Ht 63.0 in | Wt 143.8 lb

## 2020-12-02 DIAGNOSIS — Z1159 Encounter for screening for other viral diseases: Secondary | ICD-10-CM | POA: Diagnosis not present

## 2020-12-02 DIAGNOSIS — Z0000 Encounter for general adult medical examination without abnormal findings: Secondary | ICD-10-CM

## 2020-12-02 DIAGNOSIS — Z114 Encounter for screening for human immunodeficiency virus [HIV]: Secondary | ICD-10-CM | POA: Diagnosis not present

## 2020-12-02 DIAGNOSIS — Z23 Encounter for immunization: Secondary | ICD-10-CM | POA: Diagnosis not present

## 2020-12-02 DIAGNOSIS — Z1211 Encounter for screening for malignant neoplasm of colon: Secondary | ICD-10-CM | POA: Diagnosis not present

## 2020-12-02 DIAGNOSIS — Z9109 Other allergy status, other than to drugs and biological substances: Secondary | ICD-10-CM

## 2020-12-02 MED ORDER — ALBUTEROL SULFATE HFA 108 (90 BASE) MCG/ACT IN AERS: 2.0000 | INHALATION_SPRAY | Freq: Four times a day (QID) | RESPIRATORY_TRACT | 0 refills | Status: DC | PRN

## 2020-12-02 NOTE — Progress Notes (Signed)
Patient: Jasmine [name] MRN: [id number] DOB: 22-Oct-1970 PCP: Orland Mustard, MD     Subjective:  Chief Complaint  Patient presents with  . Annual Exam    HPI: The patient is a 50 y.o. female who presents today for annual exam. She denies any changes to past medical history. There have been no recent hospitalizations. They are following a well balanced diet and exercise plan. She walks 2-3 times weekly. Weight has been stable. No complaints today. Had covid about 1 month ago.    No family hx of colon or breast cancer. Has not had a colonoscopy yet.  There is no immunization history for the selected administration types on file for this patient.  Colonoscopy: needs this done. Interested in cologuard Mammogram: 07/13/2020 Pap smear: 10/04/2018   Review of Systems  Constitutional: Negative for chills, fatigue and fever.  HENT: Negative for dental problem, ear pain, hearing loss and trouble swallowing.   Eyes: Negative for visual disturbance.  Respiratory: Negative for cough, chest tightness and shortness of breath.   Cardiovascular: Negative for chest pain, palpitations and leg swelling.  Gastrointestinal: Negative for abdominal pain, blood in stool, diarrhea and nausea.  Endocrine: Negative for cold intolerance, polydipsia, polyphagia and polyuria.  Genitourinary: Negative for dysuria and hematuria.  Musculoskeletal: Negative for arthralgias.  Skin: Negative for rash.  Neurological: Negative for dizziness and headaches.  Psychiatric/Behavioral: Negative for dysphoric mood and sleep disturbance. The patient is not nervous/anxious.     Allergies Patient is allergic to cat hair extract and kiwi extract.  Past Medical History Patient  has a past medical history of Seasonal allergies.  Surgical History Patient  has a past surgical history that includes Cesarean section; Dilation and curettage of uterus; LASIK (Bilateral); Basal cell cancer excised; Wisdom tooth extraction; and  Varicose vein laser.  Family History Pateint's family history includes Cancer in her sister; Heart disease in her maternal grandfather and paternal grandfather; Hypertension in her mother.  Social History Patient  reports that she has never smoked. She has never used smokeless tobacco. She reports current alcohol use. She reports that she does not use drugs.    Objective: Vitals:   12/02/20 0931  BP: 112/60  Pulse: 73  Temp: 97.7 F (36.5 C)  TempSrc: Temporal  SpO2: 100%  Weight: 143 lb 12.8 oz (65.2 kg)  Height: 5\' 3"  (1.6 m)    Body mass index is 25.47 kg/m.  Physical Exam Vitals reviewed.  Constitutional:      Appearance: Normal appearance. She is well-developed, normal weight and well-nourished.  HENT:     Head: Normocephalic and atraumatic.     Right Ear: Tympanic membrane, ear canal and external ear normal.     Left Ear: Tympanic membrane, ear canal and external ear normal.     Nose: Nose normal.     Mouth/Throat:     Mouth: Oropharynx is clear and moist. Mucous membranes are moist.  Eyes:     Extraocular Movements: Extraocular movements intact and EOM normal.     Conjunctiva/sclera: Conjunctivae normal.     Pupils: Pupils are equal, round, and reactive to light.  Neck:     Thyroid: No thyromegaly.  Cardiovascular:     Rate and Rhythm: Normal rate and regular rhythm.     Pulses: Normal pulses and intact distal pulses.     Heart sounds: Normal heart sounds. No murmur heard.   Pulmonary:     Effort: Pulmonary effort is normal.     Breath sounds: Normal breath  sounds.  Abdominal:     General: Abdomen is flat. Bowel sounds are normal. There is no distension.     Palpations: Abdomen is soft.     Tenderness: There is no abdominal tenderness.  Musculoskeletal:        General: Normal range of motion.     Cervical back: Normal range of motion and neck supple.  Lymphadenopathy:     Cervical: No cervical adenopathy.  Skin:    General: Skin is warm and dry.      Capillary Refill: Capillary refill takes less than 2 seconds.     Findings: No rash.  Neurological:     General: No focal deficit present.     Mental Status: She is alert and oriented to person, place, and time.     Cranial Nerves: No cranial nerve deficit.     Coordination: Coordination normal.     Deep Tendon Reflexes: Reflexes normal.  Psychiatric:        Mood and Affect: Mood and affect and mood normal.        Behavior: Behavior normal.    Flowsheet Row Office Visit from 12/02/2020 in [facility]  PHQ-2 Total Score 0         Assessment/plan: 1. Annual physical exam Hm reviewed and got her utd today. She will come back for fasting labs as she has eaten today. Continue healthy lifestyle and exercise. Going through perimenopause and discussed menopause. Let me know if any issues. F/u in one year or sooner if needed.  Patient counseling [x]    Nutrition: Stressed importance of moderation in sodium/caffeine intake, saturated fat and cholesterol, caloric balance, sufficient intake of fresh fruits, vegetables, fiber, calcium, iron, and 1 mg of folate supplement per day (for females capable of pregnancy).  [x]    Stressed the importance of regular exercise.   []    Substance Abuse: Discussed cessation/primary prevention of tobacco, alcohol, or other drug use; driving or other dangerous activities under the influence; availability of treatment for abuse.   [x]    Injury prevention: Discussed safety belts, safety helmets, smoke detector, smoking near bedding or upholstery.   [x]    Sexuality: Discussed sexually transmitted diseases, partner selection, use of condoms, avoidance of unintended pregnancy  and contraceptive alternatives.  [x]    Dental health: Discussed importance of regular tooth brushing, flossing, and dental visits.  [x]    Health maintenance and immunizations reviewed. Please refer to Health maintenance section.    - CBC with Differential/Platelet; Future -  Comprehensive metabolic panel; Future - Lipid panel; Future - TSH; Future  2. Encounter for screening for HIV  - HIV Antibody (routine testing w rflx); Future  3. Encounter for hepatitis C screening test for low risk patient  - Hepatitis C antibody; Future  4. Need for Tdap vaccination  - Tdap vaccine greater than or equal to 7yo IM  5. Colon cancer screening  - Cologuard   This visit occurred during the SARS-CoV-2 public health emergency.  Safety protocols were in place, including screening questions prior to the visit, additional usage of staff PPE, and extensive cleaning of exam room while observing appropriate contact time as indicated for disinfecting solutions.      Return in about 1 year (around 12/02/2021) for annual .     , MD [facility]  12/02/2020

## 2020-12-02 NOTE — Patient Instructions (Signed)
1) ordered cologuard for you. Colon cancer screening. Make sure and check insurance before you send back.  2) routine labs, come back fasting. Water and black coffee are fine.  3) tdap vaccine today. Good for 10 years.    Preventive Care 25-50 Years Old, Female Preventive care refers to lifestyle choices and visits with your health care provider that can promote health and wellness. This includes:  A yearly physical exam. This is also called an annual wellness visit.  Regular dental and eye exams.  Immunizations.  Screening for certain conditions.  Healthy lifestyle choices, such as: ? Eating a healthy diet. ? Getting regular exercise. ? Not using drugs or products that contain nicotine and tobacco. ? Limiting alcohol use. What can I expect for my preventive care visit? Physical exam Your health care provider will check your:  Height and weight. These may be used to calculate your BMI (body mass index). BMI is a measurement that tells if you are at a healthy weight.  Heart rate and blood pressure.  Body temperature.  Skin for abnormal spots. Counseling Your health care provider may ask you questions about your:  Past medical problems.  Family's medical history.  Alcohol, tobacco, and drug use.  Emotional well-being.  Home life and relationship well-being.  Sexual activity.  Diet, exercise, and sleep habits.  Work and work Astronomer.  Access to firearms.  Method of birth control.  Menstrual cycle.  Pregnancy history. What immunizations do I need? Vaccines are usually given at various ages, according to a schedule. Your health care provider will recommend vaccines for you based on your age, medical history, and lifestyle or other factors, such as travel or where you work.   What tests do I need? Blood tests  Lipid and cholesterol levels. These may be checked every 5 years, or more often if you are over 46 years old.  Hepatitis C test.  Hepatitis B  test. Screening  Lung cancer screening. You may have this screening every year starting at age 48 if you have a 30-pack-year history of smoking and currently smoke or have quit within the past 15 years.  Colorectal cancer screening. ? All adults should have this screening starting at age 21 and continuing until age 65. ? Your health care provider may recommend screening at age 31 if you are at increased risk. ? You will have tests every 1-10 years, depending on your results and the type of screening test.  Diabetes screening. ? This is done by checking your blood sugar (glucose) after you have not eaten for a while (fasting). ? You may have this done every 1-3 years.  Mammogram. ? This may be done every 1-2 years. ? Talk with your health care provider about when you should start having regular mammograms. This may depend on whether you have a family history of breast cancer.  BRCA-related cancer screening. This may be done if you have a family history of breast, ovarian, tubal, or peritoneal cancers.  Pelvic exam and Pap test. ? This may be done every 3 years starting at age 41. ? Starting at age 54, this may be done every 5 years if you have a Pap test in combination with an HPV test. Other tests  STD (sexually transmitted disease) testing, if you are at risk.  Bone density scan. This is done to screen for osteoporosis. You may have this scan if you are at high risk for osteoporosis. Talk with your health care provider about your test results,  treatment options, and if necessary, the need for more tests. Follow these instructions at home: Eating and drinking  Eat a diet that includes fresh fruits and vegetables, whole grains, lean protein, and low-fat dairy products.  Take vitamin and mineral supplements as recommended by your health care provider.  Do not drink alcohol if: ? Your health care provider tells you not to drink. ? You are pregnant, may be pregnant, or are planning  to become pregnant.  If you drink alcohol: ? Limit how much you have to 0-1 drink a day. ? Be aware of how much alcohol is in your drink. In the [place] one drink equals one 12 oz bottle of beer (355 mL), one 5 oz glass of wine (148 mL), or one 1 oz glass of hard liquor (44 mL).   Lifestyle  Take daily care of your teeth and gums. Brush your teeth every morning and night with fluoride toothpaste. Floss one time each day.  Stay active. Exercise for at least 30 minutes 5 or more days each week.  Do not use any products that contain nicotine or tobacco, such as cigarettes, e-cigarettes, and chewing tobacco. If you need help quitting, ask your health care provider.  Do not use drugs.  If you are sexually active, practice safe sex. Use a condom or other form of protection to prevent STIs (sexually transmitted infections).  If you do not wish to become pregnant, use a form of birth control. If you plan to become pregnant, see your health care provider for a prepregnancy visit.  If told by your health care provider, take low-dose aspirin daily starting at age 19.  Find healthy ways to cope with stress, such as: ? Meditation, yoga, or listening to music. ? Journaling. ? Talking to a trusted person. ? Spending time with friends and family. Safety  Always wear your seat belt while driving or riding in a vehicle.  Do not drive: ? If you have been drinking alcohol. Do not ride with someone who has been drinking. ? When you are tired or distracted. ? While texting.  Wear a helmet and other protective equipment during sports activities.  If you have firearms in your house, make sure you follow all gun safety procedures. What's next?  Visit your health care provider once a year for an annual wellness visit.  Ask your health care provider how often you should have your eyes and teeth checked.  Stay up to date on all vaccines. This information is not intended to replace advice given to you  by your health care provider. Make sure you discuss any questions you have with your health care provider. Document Revised: 07/06/2020 Document Reviewed: 06/13/2018 Elsevier Patient Education  2021 [employer].

## 2020-12-03 ENCOUNTER — Other Ambulatory Visit: Payer: Commercial Managed Care - PPO

## 2020-12-08 ENCOUNTER — Other Ambulatory Visit (INDEPENDENT_AMBULATORY_CARE_PROVIDER_SITE_OTHER): Payer: Commercial Managed Care - PPO

## 2020-12-08 ENCOUNTER — Other Ambulatory Visit: Payer: Self-pay

## 2020-12-08 DIAGNOSIS — Z0000 Encounter for general adult medical examination without abnormal findings: Secondary | ICD-10-CM | POA: Diagnosis not present

## 2020-12-08 DIAGNOSIS — Z1159 Encounter for screening for other viral diseases: Secondary | ICD-10-CM | POA: Diagnosis not present

## 2020-12-08 DIAGNOSIS — Z114 Encounter for screening for human immunodeficiency virus [HIV]: Secondary | ICD-10-CM

## 2020-12-08 LAB — CBC WITH DIFFERENTIAL/PLATELET
Basophils Absolute: 0 10*3/uL (ref 0.0–0.1)
Basophils Relative: 0.5 % (ref 0.0–3.0)
Eosinophils Absolute: 0 10*3/uL (ref 0.0–0.7)
Eosinophils Relative: 0.7 % (ref 0.0–5.0)
HCT: 38.2 % (ref 36.0–46.0)
Hemoglobin: 12.6 g/dL (ref 12.0–15.0)
Lymphocytes Relative: 21.9 % (ref 12.0–46.0)
Lymphs Abs: 1 10*3/uL (ref 0.7–4.0)
MCHC: 33.1 g/dL (ref 30.0–36.0)
MCV: 88.6 fl (ref 78.0–100.0)
Monocytes Absolute: 0.3 10*3/uL (ref 0.1–1.0)
Monocytes Relative: 6.2 % (ref 3.0–12.0)
Neutro Abs: 3.4 10*3/uL (ref 1.4–7.7)
Neutrophils Relative %: 70.7 % (ref 43.0–77.0)
Platelets: 190 10*3/uL (ref 150.0–400.0)
RBC: 4.3 Mil/uL (ref 3.87–5.11)
RDW: 12.9 % (ref 11.5–15.5)
WBC: 4.7 10*3/uL (ref 4.0–10.5)

## 2020-12-08 LAB — COMPREHENSIVE METABOLIC PANEL
ALT: 13 U/L (ref 0–35)
AST: 19 U/L (ref 0–37)
Albumin: 3.6 g/dL (ref 3.5–5.2)
Alkaline Phosphatase: 34 U/L — ABNORMAL LOW (ref 39–117)
BUN: 8 mg/dL (ref 6–23)
CO2: 30 mEq/L (ref 19–32)
Calcium: 8.9 mg/dL (ref 8.4–10.5)
Chloride: 104 mEq/L (ref 96–112)
Creatinine, Ser: 0.7 mg/dL (ref 0.40–1.20)
GFR: 101.13 mL/min (ref >60.00)
Glucose, Bld: 84 mg/dL (ref 70–99)
Potassium: 4.2 mEq/L (ref 3.5–5.1)
Sodium: 137 mEq/L (ref 135–145)
Total Bilirubin: 0.5 mg/dL (ref 0.2–1.2)
Total Protein: 6.6 g/dL (ref 6.0–8.3)

## 2020-12-08 LAB — LIPID PANEL
Cholesterol: 187 mg/dL (ref 0–200)
HDL: 59.5 mg/dL (ref >39.00)
LDL Cholesterol: 118 mg/dL — ABNORMAL HIGH (ref 0–99)
NonHDL: 127.73
Total CHOL/HDL Ratio: 3
Triglycerides: 50 mg/dL (ref 0.0–149.0)
VLDL: 10 mg/dL (ref 0.0–40.0)

## 2020-12-08 LAB — TSH: TSH: 1.41 u[IU]/mL (ref 0.35–4.50)

## 2020-12-08 LAB — VITAMIN D 25 HYDROXY (VIT D DEFICIENCY, FRACTURES): VITD: 41.26 ng/mL (ref 30.00–100.00)

## 2020-12-08 NOTE — Addendum Note (Signed)
Addended by: Lurlean Horns on: 12/08/2020 01:04 PM   Modules accepted: Orders

## 2020-12-08 NOTE — Addendum Note (Signed)
Addended by: Lurlean Horns on: 12/08/2020 01:03 PM   Modules accepted: Orders

## 2020-12-09 LAB — HEPATITIS C ANTIBODY
Hepatitis C Ab: NONREACTIVE
SIGNAL TO CUT-OFF: 0 (ref <1.00)

## 2020-12-09 LAB — HIV ANTIBODY (ROUTINE TESTING W REFLEX): HIV 1&2 Ab, 4th Generation: NONREACTIVE

## 2020-12-25 ENCOUNTER — Other Ambulatory Visit: Payer: Self-pay | Admitting: Family Medicine

## 2021-07-25 ENCOUNTER — Encounter: Payer: Self-pay | Admitting: Nurse Practitioner

## 2022-07-31 ENCOUNTER — Encounter: Payer: Self-pay | Admitting: Nurse Practitioner

## 2022-11-14 ENCOUNTER — Other Ambulatory Visit (HOSPITAL_COMMUNITY)
Admission: RE | Admit: 2022-11-14 | Discharge: 2022-11-14 | Disposition: A | Discharge status: Home / Self Care | Payer: Commercial Managed Care - PPO | Source: Ambulatory Visit | Attending: Nurse Practitioner | Admitting: Nurse Practitioner

## 2022-11-14 ENCOUNTER — Ambulatory Visit (INDEPENDENT_AMBULATORY_CARE_PROVIDER_SITE_OTHER): Payer: Commercial Managed Care - PPO | Admitting: Nurse Practitioner

## 2022-11-14 ENCOUNTER — Encounter: Payer: Self-pay | Admitting: Nurse Practitioner

## 2022-11-14 VITALS — BP 100/64 | Ht 63.0 in | Wt 137.0 lb

## 2022-11-14 DIAGNOSIS — Z01419 Encounter for gynecological examination (general) (routine) without abnormal findings: Secondary | ICD-10-CM | POA: Diagnosis present

## 2022-11-14 DIAGNOSIS — Z1211 Encounter for screening for malignant neoplasm of colon: Secondary | ICD-10-CM

## 2022-11-14 DIAGNOSIS — N951 Menopausal and female climacteric states: Secondary | ICD-10-CM

## 2022-11-14 NOTE — Progress Notes (Signed)
   Jasmine [name] [birth year] [id number]   History:  52 y.o. [id number] presents for annual exam. LMP April 2023, mild menopausal symptoms. Normal pap and mammogram history.   Gynecologic History No LMP recorded. (Menstrual status: Irregular Periods). Period Pattern: (!) Irregular Menstrual Flow: Light Dysmenorrhea: None Contraception/Family planning: condoms Sexually active: Yes  Health Maintenance Last Pap: 10/04/2018. Results were: Normal neg HPV Last mammogram: 07/31/2022. Results were: Normal Last colonoscopy: Never Last Dexa: Not indicated  Past medical history, past surgical history, family history and social history were all reviewed and documented in the EPIC chart. 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   ROS:  A ROS was performed and pertinent positives and negatives are included.  Exam:  Vitals:   11/14/22 1025  BP: 100/64  Weight: 137 lb (62.1 kg)  Height: 5\' 3"  (1.6 m)    Body mass index is 24.27 kg/m.  General appearance:  Normal Thyroid:  Symmetrical, normal in size, without palpable masses or nodularity. Respiratory  Auscultation:  Clear without wheezing or rhonchi Cardiovascular  Auscultation:  Regular rate, without rubs, murmurs or gallops  Edema/varicosities:  Not grossly evident Abdominal  Soft,nontender, without masses, guarding or rebound.  Liver/spleen:  No organomegaly noted  Hernia:  None appreciated  Skin  Inspection:  Grossly normal Breasts: Examined lying and sitting.   Right: Without masses, retractions, nipple discharge or axillary adenopathy.   Left: Without masses, retractions, nipple discharge or axillary adenopathy. Genitourinary   Inguinal/mons:  Normal without inguinal adenopathy  External genitalia:  Normal appearing vulva with no masses, tenderness, or lesions  BUS/Urethra/Skene's glands:  Normal  Vagina:  Normal appearing with normal color and discharge, no lesions  Cervix:  Normal appearing without discharge or  lesions  Uterus:  Normal in size, shape and contour.  Midline and mobile, nontender  Adnexa/parametria:     Rt: Normal in size, without masses or tenderness.   Lt: Normal in size, without masses or tenderness.  Anus and perineum: Normal  Digital rectal exam: Deferred  Patient informed chaperone available to be present for breast and pelvic exam. Patient has requested no chaperone to be present. Patient has been advised what will be completed during breast and pelvic exam.   Assessment/Plan:  52 y.o. [id number] for annual exam.   Well female exam with routine gynecological exam - Plan: Cytology - PAP( [place]). Education provided on SBEs, importance of preventative screenings, current guidelines, high calcium diet, regular exercise, and multivitamin daily. Labs with PCP.   Screening for colon cancer - Plan: Cologuard. Average risk. Discussed Cologuard versus colonoscopy.   Perimenopausal - LMP in April 2023, mild menopausal symptoms.   Screening for cervical cancer - Normal Pap history.  Pap today.   Screening for breast cancer - Normal mammogram history.  Continue annual screenings.  Normal breast exam today.  Follow up in 1 year for annual.     Tamela Gammon [facility], 10:51 AM 11/14/2022

## 2022-11-16 LAB — CYTOLOGY - PAP
Comment: NEGATIVE
Diagnosis: NEGATIVE
High risk HPV: NEGATIVE

## 2023-08-01 ENCOUNTER — Encounter: Payer: Self-pay | Admitting: Nurse Practitioner

## 2024-01-17 ENCOUNTER — Encounter: Payer: Self-pay | Admitting: Nurse Practitioner

## 2024-01-17 ENCOUNTER — Ambulatory Visit (INDEPENDENT_AMBULATORY_CARE_PROVIDER_SITE_OTHER): Payer: Commercial Managed Care - PPO | Admitting: Nurse Practitioner

## 2024-01-17 VITALS — BP 108/64 | HR 64 | Resp 17 | Ht 62.75 in | Wt 143.0 lb

## 2024-01-17 DIAGNOSIS — Z1211 Encounter for screening for malignant neoplasm of colon: Secondary | ICD-10-CM

## 2024-01-17 DIAGNOSIS — Z01419 Encounter for gynecological examination (general) (routine) without abnormal findings: Secondary | ICD-10-CM

## 2024-01-17 DIAGNOSIS — Z780 Asymptomatic menopausal state: Secondary | ICD-10-CM

## 2024-01-17 DIAGNOSIS — E7800 Pure hypercholesterolemia, unspecified: Secondary | ICD-10-CM

## 2024-01-17 NOTE — Progress Notes (Signed)
   Jasmine [name] 1971-03-19 [id number]   History:  53 y.o. [id number] presents for annual exam. Postmenopausal - no HRT. LMP January 2024. Normal pap history.   Gynecologic History No LMP recorded. Patient is postmenopausal.   Contraception/Family planning: Postmenopausal Sexually active: Yes  Health Maintenance Last Pap: 11/14/2022 Results were: Normal neg HPV Last mammogram: 07/31/2023. Results were: Normal Last colonoscopy: Never Last Dexa: Not indicated  Past medical history, past surgical history, family history and social history were all reviewed and documented in the EPIC chart. 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   ROS:  A ROS was performed and pertinent positives and negatives are included.  Exam:  Vitals:   01/17/24 1002  BP: 108/64  Pulse: 64  Resp: 17  Weight: 143 lb (64.9 kg)  Height: 5' 2.75" (1.594 m)     Body mass index is 25.53 kg/m.  General appearance:  Normal Thyroid:  Symmetrical, normal in size, without palpable masses or nodularity. Respiratory  Auscultation:  Clear without wheezing or rhonchi Cardiovascular  Auscultation:  Regular rate, without rubs, murmurs or gallops  Edema/varicosities:  Not grossly evident Abdominal  Soft,nontender, without masses, guarding or rebound.  Liver/spleen:  No organomegaly noted  Hernia:  None appreciated  Skin  Inspection:  Grossly normal Breasts: Examined lying and sitting.   Right: Without masses, retractions, nipple discharge or axillary adenopathy.   Left: Without masses, retractions, nipple discharge or axillary adenopathy. Pelvic: External genitalia:  no lesions              Urethra:  normal appearing urethra with no masses, tenderness or lesions              Bartholins and Skenes: normal                 Vagina: normal appearing vagina with normal color and discharge, no lesions              Cervix: no lesions Bimanual Exam:  Uterus:  no masses or tenderness              Adnexa: no mass,  fullness, tenderness              Rectovaginal: Deferred              Anus:  normal, no lesions  Patient informed chaperone available to be present for breast and pelvic exam. Patient has requested no chaperone to be present. Patient has been advised what will be completed during breast and pelvic exam.   Assessment/Plan:  53 y.o. [id number] for annual exam.   Well female exam with routine gynecological exam - Plan: CBC with Differential/Platelet, Comprehensive metabolic panel with GFR, VITAMIN D 25 Hydroxy (Vit-D Deficiency, Fractures). Education provided on SBEs, importance of preventative screenings, current guidelines, high calcium diet, regular exercise, and multivitamin daily.   Postmenopausal - no HRT  Screening for colon cancer - Plan: Cologuard. Average risk. Cologuard versus colonoscopy discussed.   Elevated LDL cholesterol level - Plan: Lipid panel  Screening for cervical cancer - Normal Pap history.  Will repeat at 5-year interval per guidelines.   Screening for breast cancer - Normal mammogram history.  Continue annual screenings.  Normal breast exam today.  Return in about 1 year (around 01/16/2025) for Annual.    Olivia Mackie [facility], 10:28 AM 01/17/2024

## 2024-01-18 LAB — CBC WITH DIFFERENTIAL/PLATELET
Absolute Lymphocytes: 1467 {cells}/uL (ref 850–3900)
Absolute Monocytes: 327 {cells}/uL (ref 200–950)
Basophils Absolute: 28 {cells}/uL (ref 0–200)
Basophils Relative: 0.6 %
Eosinophils Absolute: 60 {cells}/uL (ref 15–500)
Eosinophils Relative: 1.3 %
HCT: 42.7 % (ref 35.0–45.0)
Hemoglobin: 14.2 g/dL (ref 11.7–15.5)
MCH: 29.8 pg (ref 27.0–33.0)
MCHC: 33.3 g/dL (ref 32.0–36.0)
MCV: 89.7 fL (ref 80.0–100.0)
MPV: 11.3 fL (ref 7.5–12.5)
Monocytes Relative: 7.1 %
Neutro Abs: 2719 {cells}/uL (ref 1500–7800)
Neutrophils Relative %: 59.1 %
Platelets: 184 10*3/uL (ref 140–400)
RBC: 4.76 10*6/uL (ref 3.80–5.10)
RDW: 11.8 % (ref 11.0–15.0)
Total Lymphocyte: 31.9 %
WBC: 4.6 10*3/uL (ref 3.8–10.8)

## 2024-01-18 LAB — COMPREHENSIVE METABOLIC PANEL WITH GFR
AG Ratio: 2 (calc) (ref 1.0–2.5)
ALT: 18 U/L (ref 6–29)
AST: 23 U/L (ref 10–35)
Albumin: 4.6 g/dL (ref 3.6–5.1)
Alkaline phosphatase (APISO): 43 U/L (ref 37–153)
BUN: 15 mg/dL (ref 7–25)
CO2: 31 mmol/L (ref 20–32)
Calcium: 9.5 mg/dL (ref 8.6–10.4)
Chloride: 102 mmol/L (ref 98–110)
Creat: 0.73 mg/dL (ref 0.50–1.03)
Globulin: 2.3 g/dL (ref 1.9–3.7)
Glucose, Bld: 87 mg/dL (ref 65–99)
Potassium: 4.9 mmol/L (ref 3.5–5.3)
Sodium: 138 mmol/L (ref 135–146)
Total Bilirubin: 0.7 mg/dL (ref 0.2–1.2)
Total Protein: 6.9 g/dL (ref 6.1–8.1)
eGFR: 98 mL/min/{1.73_m2} (ref >=60)

## 2024-01-18 LAB — LIPID PANEL
Cholesterol: 247 mg/dL — ABNORMAL HIGH (ref <200)
HDL: 68 mg/dL (ref >=50)
LDL Cholesterol (Calc): 159 mg/dL — ABNORMAL HIGH
Non-HDL Cholesterol (Calc): 179 mg/dL — ABNORMAL HIGH (ref <130)
Total CHOL/HDL Ratio: 3.6 (calc) (ref <5.0)
Triglycerides: 91 mg/dL (ref <150)

## 2024-01-18 LAB — VITAMIN D 25 HYDROXY (VIT D DEFICIENCY, FRACTURES): Vit D, 25-Hydroxy: 41 ng/mL (ref 30–100)

## 2024-01-21 ENCOUNTER — Encounter: Payer: Self-pay | Admitting: Nurse Practitioner

## 2024-02-09 LAB — COLOGUARD: COLOGUARD: NEGATIVE

## 2024-02-11 ENCOUNTER — Encounter: Payer: Self-pay | Admitting: Nurse Practitioner

## 2024-08-05 LAB — HM MAMMOGRAPHY

## 2024-08-08 ENCOUNTER — Encounter: Payer: Self-pay | Admitting: Nurse Practitioner
